# Patient Record
Sex: Female | Born: 1965 | Race: White | Hispanic: No | Marital: Married | Smoking: Never smoker
Health system: Southern US, Community
[De-identification: ages and names within clinical notes are randomized; demographics above are authoritative.]

## PROBLEM LIST (undated history)

## (undated) DIAGNOSIS — I10 Essential (primary) hypertension: Secondary | ICD-10-CM

## (undated) DIAGNOSIS — R42 Dizziness and giddiness: Secondary | ICD-10-CM

## (undated) DIAGNOSIS — T7840XA Allergy, unspecified, initial encounter: Secondary | ICD-10-CM

## (undated) HISTORY — DX: Dizziness and giddiness: R42

## (undated) HISTORY — DX: Allergy, unspecified, initial encounter: T78.40XA

## (undated) HISTORY — PX: MANDIBLE SURGERY: SHX707

---

## 2002-01-24 ENCOUNTER — Emergency Department (HOSPITAL_COMMUNITY): Admission: EM | Admit: 2002-01-24 | Discharge: 2002-01-24 | Payer: Self-pay | Admitting: Emergency Medicine

## 2004-04-18 HISTORY — PX: FRACTURE SURGERY: SHX138

## 2004-05-16 ENCOUNTER — Inpatient Hospital Stay (HOSPITAL_COMMUNITY): Admission: EM | Admit: 2004-05-16 | Discharge: 2004-05-19 | Payer: Self-pay | Admitting: Emergency Medicine

## 2004-07-07 ENCOUNTER — Encounter: Admission: RE | Admit: 2004-07-07 | Discharge: 2004-09-27 | Payer: Self-pay | Admitting: Orthopedic Surgery

## 2005-09-23 ENCOUNTER — Other Ambulatory Visit: Admission: RE | Admit: 2005-09-23 | Discharge: 2005-09-23 | Payer: Self-pay | Admitting: Internal Medicine

## 2005-10-26 ENCOUNTER — Encounter: Admission: RE | Admit: 2005-10-26 | Discharge: 2005-10-26 | Payer: Self-pay | Admitting: Internal Medicine

## 2005-12-21 ENCOUNTER — Ambulatory Visit: Payer: Self-pay | Admitting: Family Medicine

## 2006-01-18 ENCOUNTER — Ambulatory Visit: Payer: Self-pay | Admitting: Family Medicine

## 2006-04-19 ENCOUNTER — Emergency Department (HOSPITAL_COMMUNITY): Admission: EM | Admit: 2006-04-19 | Discharge: 2006-04-19 | Payer: Self-pay | Admitting: Emergency Medicine

## 2006-05-17 ENCOUNTER — Ambulatory Visit: Payer: Self-pay | Admitting: Family Medicine

## 2006-06-11 DIAGNOSIS — F3289 Other specified depressive episodes: Secondary | ICD-10-CM | POA: Insufficient documentation

## 2006-06-11 DIAGNOSIS — J309 Allergic rhinitis, unspecified: Secondary | ICD-10-CM | POA: Insufficient documentation

## 2006-06-11 DIAGNOSIS — G43009 Migraine without aura, not intractable, without status migrainosus: Secondary | ICD-10-CM

## 2006-06-11 DIAGNOSIS — F329 Major depressive disorder, single episode, unspecified: Secondary | ICD-10-CM

## 2006-06-13 ENCOUNTER — Ambulatory Visit: Payer: Self-pay | Admitting: Family Medicine

## 2006-06-14 ENCOUNTER — Encounter (INDEPENDENT_AMBULATORY_CARE_PROVIDER_SITE_OTHER): Payer: Self-pay | Admitting: Family Medicine

## 2006-10-06 ENCOUNTER — Ambulatory Visit: Payer: Self-pay | Admitting: Family Medicine

## 2006-10-06 ENCOUNTER — Encounter (INDEPENDENT_AMBULATORY_CARE_PROVIDER_SITE_OTHER): Payer: Self-pay | Admitting: Family Medicine

## 2006-10-06 ENCOUNTER — Other Ambulatory Visit: Admission: RE | Admit: 2006-10-06 | Discharge: 2006-10-06 | Payer: Self-pay | Admitting: Family Medicine

## 2006-10-08 LAB — CONVERTED CEMR LAB
Basophils Absolute: 0 10*3/uL (ref 0.0–0.1)
Basophils Relative: 0.1 % (ref 0.0–1.0)
Cholesterol: 158 mg/dL (ref 0–200)
Eosinophils Relative: 0.8 % (ref 0.0–5.0)
HCT: 41.1 % (ref 36.0–46.0)
Hemoglobin: 13.7 g/dL (ref 12.0–15.0)
LDL Cholesterol: 100 mg/dL — ABNORMAL HIGH (ref 0–99)
MCHC: 33.3 g/dL (ref 30.0–36.0)
Monocytes Absolute: 0.4 10*3/uL (ref 0.2–0.7)
Neutrophils Relative %: 71.1 % (ref 43.0–77.0)
RBC: 4.8 M/uL (ref 3.87–5.11)
RDW: 13.2 % (ref 11.5–14.6)
Total CHOL/HDL Ratio: 3.8
VLDL: 16 mg/dL (ref 0–40)
WBC: 6.4 10*3/uL (ref 4.5–10.5)

## 2006-10-09 ENCOUNTER — Telehealth (INDEPENDENT_AMBULATORY_CARE_PROVIDER_SITE_OTHER): Payer: Self-pay | Admitting: *Deleted

## 2006-10-18 ENCOUNTER — Telehealth (INDEPENDENT_AMBULATORY_CARE_PROVIDER_SITE_OTHER): Payer: Self-pay | Admitting: *Deleted

## 2006-12-27 ENCOUNTER — Encounter: Payer: Self-pay | Admitting: Internal Medicine

## 2007-01-03 ENCOUNTER — Ambulatory Visit: Payer: Self-pay | Admitting: Family Medicine

## 2007-01-03 DIAGNOSIS — R079 Chest pain, unspecified: Secondary | ICD-10-CM

## 2007-01-17 ENCOUNTER — Encounter (INDEPENDENT_AMBULATORY_CARE_PROVIDER_SITE_OTHER): Payer: Self-pay | Admitting: Family Medicine

## 2007-01-17 ENCOUNTER — Other Ambulatory Visit: Admission: RE | Admit: 2007-01-17 | Discharge: 2007-01-17 | Payer: Self-pay | Admitting: Family Medicine

## 2007-01-17 ENCOUNTER — Ambulatory Visit: Payer: Self-pay | Admitting: Family Medicine

## 2007-01-29 ENCOUNTER — Telehealth (INDEPENDENT_AMBULATORY_CARE_PROVIDER_SITE_OTHER): Payer: Self-pay | Admitting: *Deleted

## 2007-05-08 ENCOUNTER — Ambulatory Visit: Payer: Self-pay | Admitting: Family Medicine

## 2007-05-08 DIAGNOSIS — R51 Headache: Secondary | ICD-10-CM

## 2007-05-08 DIAGNOSIS — R42 Dizziness and giddiness: Secondary | ICD-10-CM

## 2007-05-08 DIAGNOSIS — R519 Headache, unspecified: Secondary | ICD-10-CM | POA: Insufficient documentation

## 2007-05-14 ENCOUNTER — Encounter: Admission: RE | Admit: 2007-05-14 | Discharge: 2007-05-14 | Payer: Self-pay | Admitting: Family Medicine

## 2007-05-23 ENCOUNTER — Telehealth (INDEPENDENT_AMBULATORY_CARE_PROVIDER_SITE_OTHER): Payer: Self-pay | Admitting: *Deleted

## 2007-05-24 ENCOUNTER — Telehealth (INDEPENDENT_AMBULATORY_CARE_PROVIDER_SITE_OTHER): Payer: Self-pay | Admitting: *Deleted

## 2007-05-25 ENCOUNTER — Telehealth (INDEPENDENT_AMBULATORY_CARE_PROVIDER_SITE_OTHER): Payer: Self-pay | Admitting: *Deleted

## 2007-06-01 ENCOUNTER — Ambulatory Visit (HOSPITAL_COMMUNITY): Admission: RE | Admit: 2007-06-01 | Discharge: 2007-06-01 | Payer: Self-pay | Admitting: Family Medicine

## 2007-06-08 ENCOUNTER — Ambulatory Visit: Payer: Self-pay | Admitting: Family Medicine

## 2007-06-14 ENCOUNTER — Encounter (INDEPENDENT_AMBULATORY_CARE_PROVIDER_SITE_OTHER): Payer: Self-pay | Admitting: *Deleted

## 2007-09-20 ENCOUNTER — Encounter: Payer: Self-pay | Admitting: Internal Medicine

## 2007-09-25 ENCOUNTER — Telehealth (INDEPENDENT_AMBULATORY_CARE_PROVIDER_SITE_OTHER): Payer: Self-pay | Admitting: *Deleted

## 2008-01-12 ENCOUNTER — Encounter: Payer: Self-pay | Admitting: Internal Medicine

## 2010-09-03 NOTE — Discharge Summary (Signed)
NAMEKOLBIE, CLARKSTON NO.:  [id number]   MEDICAL RECORD NO.:  [id number]          PATIENT TYPE:  INP   LOCATION:  5039                         FACILITY:  [facility]   PHYSICIAN:  Vania Rea. Supple, M.D.  DATE OF BIRTH:  Sep 16, 1965   DATE OF ADMISSION:  05/16/2004  DATE OF DISCHARGE:  05/19/2004                                 DISCHARGE SUMMARY   ADMISSION DIAGNOSES:  1.  Left fifth metatarsal fracture and calcaneus avulsion fracture.  2.  Displaced right distal fibular fracture.   DISCHARGE DIAGNOSES:  1.  Left fifth metatarsal fracture and calcaneus avulsion fracture.  2.  Displaced right distal fibular fracture.  3.  Status post open reduction and internal fixation of right distal fibular      fracture.   OPERATION:  Open reduction and internal fixation of right distal fibular  fracture.   SURGEON:  Vania Rea. Supple, M.D.   Threasa HeadsFrench Ana A. Shuford, P.A.-C.   ANESTHESIA:  General anesthetic.   BRIEF HISTORY:  Ms. Amsden is a 45 year old female who slipped and fell in  the home and had bilateral ankle pain and inability to bear weight.  X-rays  in the emergency department showed a displaced right distal fibular fracture  as well as a fifth metatarsal fracture and a calcaneal avulsion fracture.  She was treated with closed casting on the left, but operative intervention,  as mentioned, was indicated.  The risks and benefits were discussed and she  wished to proceed.   HOSPITAL COURSE:  The patient was admitted on __________ all appropriate IV  antibiotics and analgesics were utilized.  Postoperatively she was allowed  weightbearing in her cam walker on the left for transfers and  nonweightbearing on the right, touchdown weightbearing at max.  Postoperatively she began working with therapy.  Home needs were arranged.  By date May 19, 2004 she was doing well.  She had been afebrile.  Neurovascularly she was intact.  Her splints were fitting well without  difficulty.  At this time she was stable for discharge to home in the care  of her family and follow up on an outpatient basis.   LABORATORY DATA:  Preoperative labs within normal limits except for white  count of 13.3, sodium 134.  Urinalysis negative.  Chest x-ray was negative  on date May 16, 2004.  Fractures of the ankle and foot as noted above.  EKG showed normal sinus rhythm.   CONDITION ON DISCHARGE:  Stable and improved.   DISCHARGE MEDICATIONS AND PLANS:  The patient is being discharged to home.  She is to stand weightbearing on the right, weightbearing on the left, use  cam walker for transfers.  Keep splints clean and dry.  Follow up in 2  weeks, call for time.  Prescription for Robaxin, Percocet, and Restoril  provided.  Take an aspirin at home.  Resume home meds on diet.      TAS/MEDQ  D:  07/13/2004  T:  07/13/2004  Job:  [id number]

## 2010-09-03 NOTE — Op Note (Signed)
Mercedes Robertson, Mercedes Robertson NO.:  [id number]   MEDICAL RECORD NO.:  [id number]          PATIENT TYPE:  INP   LOCATION:  5039                         FACILITY:  [facility]   PHYSICIAN:  Vania Rea. Supple, M.D.  DATE OF BIRTH:  1965-11-14   DATE OF PROCEDURE:  05/17/2004  DATE OF DISCHARGE:                                 OPERATIVE REPORT   PREOPERATIVE DIAGNOSIS:  Displaced right distal fibula fracture.   POSTOPERATIVE DIAGNOSIS:  Displaced right distal fibula fracture.   OPERATION PERFORMED:  Open reduction internal fixation of right distal  fibular fracture.   SURGEON:  Vania Rea. Supple, M.D.   Threasa HeadsFrench Ana A. Shuford, P.A.-C.   ANESTHESIA:  General endotracheal.   TOURNIQUET TIME:  Less than one hour.   ESTIMATED BLOOD LOSS:  Minimal.   DRAINS:  None.   INDICATIONS FOR PROCEDURE:  [name] is a 45 year old female who slipped  at home yesterday evening while descending some stairs and injured both of  her feet and ankles.  On evaluation in the emergency room last night, she  was found to have local swelling and tenderness over the distal fibula on  the right.  The left foot showed diffuse lateral tenderness about the  forefoot, midfoot and hindfoot.  Radiographs confirmed a mildly comminuted  and displaced fracture of the right distal fibula as well as a left fifth  metatarsal shaft fracture and small avulsion fractures from the dorsum of  the navicular and the lateral calcaneus on the left.  The left foot and  ankle injuries were felt to be amenable to nonoperative management.  The  right ankle injury, however, would benefit from open reduction internal  fixation to restore anatomic alignment.   I discussed various treatment options with Mr. Tabak and her husband  preoperatively.  Possible surgical complications reviewed including the  potential for bleeding, infection, neurovascular injury, deep venous  thrombosis, pulmonary embolus, malunion, nonunion,  loss of fixation, post  traumatic arthritis and possible need for additional surgery.  They  understand and accept and agree with our planned procedure.   DESCRIPTION OF PROCEDURE:  After undergoing routine preop evaluation, the  patient received prophylactic antibiotics.  Placed supine on the operating  table and underwent smooth induction of general endotracheal anesthesia.  Tourniquet applied to the right thigh and the right leg was sterilely  prepped and draped in the standard fashion.  Leg was exsanguinated with the  tourniquet inflated at 350 mmHg.  A lateral longitudinal incision was made  over the distal shaft of the fibula, total length approximately 8 cm.  Skin  flaps were elevated circumferentially.  The distal fibular shaft was then  identified and exposed in subperiosteal fashion with the peroneal muscle and  tendon reflected posteriorly.  The fracture site had a degree of comminution  and this was carefully reduced under direct visualization with interposed  soft tissue, both fragments removed from the fracture site.  Reduction under  direct visualization was achieved.  Fluoroscopic images were then obtained  which confirmed good alignment of the fracture site.  A 6-hole one-third  tubular plate  was then contoured to fit over the lateral aspect of the  distal fibula.  This was then fastened with standard AO technique with 3.5  cortical screws proximally times four and two 4.0 cancellous screws  distally.  Good bony purchase was achieved from the screws.  Final  fluoroscopic images confirmed good alignment of the fracture site and good  position of the hardware.  The wound was then copiously irrigated.  The  wound was closed with 0 Vicryl to the deep fascia, 2-0 Vicryl to the subcu  and intracuticular 3-0 Monocryl for the skin followed by Steri-Strips.  0.5%  Marcaine with epinephrine was instilled along the skin edges.  A bulky dry  dressing was then applied followed by a  well padded short leg plaster splint  with the ankle in neutral position.  The tourniquet was then let down.  The  patient was extubated and taken to recovery room in stable condition.  I  should also note that we did reposition and appropriately pad the left foot  and ankle and the Cam walker splint prior to the beginning of the case to  make the left foot was properly positioned and padded within her Cam walker  boot.      KMS/MEDQ  D:  05/17/2004  T:  05/17/2004  Job:  [id number]

## 2010-09-03 NOTE — Assessment & Plan Note (Signed)
[facility]                          [place] OFFICE NOTE   NAME:Robertson, Mercedes ISABELL                    MRN:          [id number]  DATE:12/21/2005                            DOB:          06/10/1965    REASON FOR VISIT:  Left wrist pain.   HISTORY:  Mercedes Robertson is a 45 year old female presenting here to establish  care and complains that over the last week to week and a half she has been  having significant pain of the left wrist, specifically the ulnar side.  The  pain is precipitated with gripping, holding objects, and lifting her  children.  She denied any specific injuries but states that she is very  active with her upper extremities.  She is not taking anything for the pain.  She denies any weakness but there is mild tingling in the fourth and fifth  digit when the discomfort is significant.  The pain is usually worse in the  evening.   Additionally, Mercedes Robertson started to reveal a several month history of  depressed mood, lack of energy, and drive, increased irritability with her  children, and difficulties coping with the separation from her husband.  She  states that initially when she was separated from her husband, she was  started on Lexapro which was not at all helpful.  She did see a therapist  but again did not find that helpful at that point.  She denies any homicidal  or suicidal ideations.   PAST MEDICAL HISTORY:  1. Seasonal allergic rhinitis.  2. TMJ, status post jaw surgery.   PAST SURGICAL HISTORY:  1. Fracture of the right ankle.  2. Fracture of the left foot.   MEDICATIONS:  Zyrtec 10 mg q. day.   FAMILY HISTORY:  Mother with a history of hypertension and father with  hypertension and carotid artery disease.  Brother and sister are alive and  well.   SOCIAL HISTORY:  She is separated with two children, she is unemployed, and  stays at home with her children.  She denies any tobacco or alcohol use.   REVIEW  OF SYSTEMS:  As per history of present illness, otherwise  unremarkable.   PHYSICAL EXAMINATION:  VITALS:  Weight 217, pulse of 86, and blood pressure  120/88.  GENERAL:  We have a pleasant female who became tearful as she spoke about  her mood.  Speech was regular rate and rhythm.  Affect is depressed.  EXTREMITIES:  Examination of the left upper extremity is significant for no  obvious deformity.  There is mild tenderness just distal to the ulnar  styloid.  Pain is precipitated with grip as well as resisted pronation and  supination of the wrist.   IMPRESSION:  1. Left wrist strain.  2. Major depressive episode.   PLAN:  1. In regards to her wrist, the patient will be started on Naprosyn 500 mg      b.i.d. for 10 to 15 days.  She will also be started on over-the-counter      products like OTC.  The side effects of the medicines will be reviewed.  2. In regards to her depression, I did advise the patient to seek a      therapist.  According to Mercedes Robertson, her son sees a therapist and she      has been invited to see her after the patient.  The patient states that      she will call to set that up.  3. After a long discussion, she would prefer to start on antidepressant      and so I will start on Cymbalta 30 mg q. day for one week and increase      to 60 mg q. day.  Side effects were reviewed including the black box      warning of all antidepressants.  4. The patient is to follow up with me in four weeks or sooner if needed.                                   Leanne Chang, M.D.   LA/MedQ  DD:  12/21/2005  DT:  12/22/2005  Job #:  [id number]

## 2011-01-07 LAB — BASIC METABOLIC PANEL
CO2: 29
Calcium: 8.7
Chloride: 105
GFR calc Af Amer: >60
Glucose, Bld: 95
Potassium: 3.8
Sodium: 139

## 2011-01-07 LAB — CBC
HCT: 37.3
Hemoglobin: 12.7
MCHC: 34
RBC: 4.34
RDW: 13.1

## 2011-01-07 LAB — PROTIME-INR: INR: 0.9

## 2011-10-22 ENCOUNTER — Emergency Department (HOSPITAL_BASED_OUTPATIENT_CLINIC_OR_DEPARTMENT_OTHER)
Admission: EM | Admit: 2011-10-22 | Discharge: 2011-10-22 | Disposition: A | Discharge status: Home / Self Care | Payer: BC Managed Care – PPO | Attending: Emergency Medicine | Admitting: Emergency Medicine

## 2011-10-22 ENCOUNTER — Emergency Department (HOSPITAL_BASED_OUTPATIENT_CLINIC_OR_DEPARTMENT_OTHER): Payer: BC Managed Care – PPO

## 2011-10-22 ENCOUNTER — Encounter (HOSPITAL_BASED_OUTPATIENT_CLINIC_OR_DEPARTMENT_OTHER): Payer: Self-pay | Admitting: *Deleted

## 2011-10-22 DIAGNOSIS — S63259A Unspecified dislocation of unspecified finger, initial encounter: Secondary | ICD-10-CM

## 2011-10-22 DIAGNOSIS — Y936A Activity, physical games generally associated with school recess, summer camp and children: Secondary | ICD-10-CM | POA: Insufficient documentation

## 2011-10-22 DIAGNOSIS — X58XXXA Exposure to other specified factors, initial encounter: Secondary | ICD-10-CM | POA: Insufficient documentation

## 2011-10-22 DIAGNOSIS — IMO0002 Reserved for concepts with insufficient information to code with codable children: Secondary | ICD-10-CM | POA: Insufficient documentation

## 2011-10-22 DIAGNOSIS — Y998 Other external cause status: Secondary | ICD-10-CM | POA: Insufficient documentation

## 2011-10-22 DIAGNOSIS — S62619A Displaced fracture of proximal phalanx of unspecified finger, initial encounter for closed fracture: Secondary | ICD-10-CM

## 2011-10-22 MED ORDER — HYDROCODONE-ACETAMINOPHEN 5-500 MG PO TABS: 1.0000 | ORAL_TABLET | Freq: Four times a day (QID) | ORAL | Status: AC | PRN

## 2011-10-22 MED ORDER — OXYCODONE-ACETAMINOPHEN 5-325 MG PO TABS
1.0000 | ORAL_TABLET | Freq: Once | ORAL | Status: AC
  Administered 2011-10-22: 1 via ORAL
  Filled 2011-10-22: qty 1

## 2011-10-22 NOTE — ED Provider Notes (Addendum)
History   This chart was scribed for Gwyneth Sprout, MD by Sofie Rower. The patient was seen in room MH12/MH12 and the patient's care was started at 3:37PM    CSN: [id number]  Arrival date & time 10/22/11  1413   First MD Initiated Contact with Patient 10/22/11 1536      Chief Complaint  Patient presents with  . Finger Injury    (Consider location/radiation/quality/duration/timing/severity/associated sxs/prior treatment) Patient is a 46 y.o. female presenting with hand pain. The history is provided by the patient.  Hand Pain This is a new problem. The current episode started 1 to 2 hours ago. The problem occurs constantly. The problem has not changed since onset.The symptoms are aggravated by bending. Nothing relieves the symptoms. She has tried nothing for the symptoms. The treatment provided no relief.    Mercedes Robertson is a 46 y.o. female who presents to the Emergency Department complaining of moderate, episodic finger injury onset today (2 hours ago). The pt informs the EDP that she was playing kickball where she injured her hand. Pt denies any allergies to medications.   PCP is Dr. Drue Novel.    History reviewed. No pertinent past medical history.  Past Surgical History  Procedure Date  . Mandible surgery   . Fracture surgery     History reviewed. No pertinent family history.  History  Substance Use Topics  . Smoking status: Never Smoker   . Smokeless tobacco: Not on file  . Alcohol Use: Yes    OB History    Grav Para Term Preterm Abortions TAB SAB Ect Mult Living                  Review of Systems  All other systems reviewed and are negative.    10 Systems reviewed and all are negative for acute change except as noted in the HPI.    Allergies  Review of patient's allergies indicates no known allergies.  Home Medications   Current Outpatient Rx  Name Route Sig Dispense Refill  . CETIRIZINE HCL 10 MG PO TABS Oral Take 10 mg by mouth daily.    Marland Kitchen  CLONAZEPAM 1 MG PO TABS Oral Take 1 mg by mouth daily.    Marland Kitchen PHENTERMINE HCL 37.5 MG PO CAPS Oral Take 37.5 mg by mouth every morning.    Marland Kitchen RISPERIDONE 1 MG PO TABS Oral Take 1 mg by mouth daily.      BP 140/98  Pulse 82  Temp 98.3 F (36.8 C) (Oral)  Resp 20  Ht 5\' 5"  (1.651 m)  Wt 175 lb (79.379 kg)  BMI 29.12 kg/m2  SpO2 100%  LMP 10/22/2011  Physical Exam  Nursing note and vitals reviewed. Constitutional: She appears well-developed and well-nourished. She appears distressed.  HENT:  Head: Normocephalic and atraumatic.  Nose: Nose normal.  Eyes: EOM are normal. Pupils are equal, round, and reactive to light.  Musculoskeletal: Normal range of motion.       Hands: Neurological: She is alert.  Skin: Skin is warm and dry.  Psychiatric: She has a normal mood and affect. Her behavior is normal.    ED Course  Reduction of dislocation Date/Time: 10/22/2011 4:14 PM Performed by: Gwyneth Sprout Authorized by: Gwyneth Sprout Consent: Verbal consent obtained. Consent given by: patient Imaging studies: imaging studies available Local anesthesia used: no Patient sedated: no Patient tolerance: Patient tolerated the procedure well with no immediate complications. Comments: Left fifth PIP joint dislocation was reduced in the ER with manual  finger extenstion then flexion and traction.     (including critical care time)  DIAGNOSTIC STUDIES: Oxygen Saturation is 100% on room air, normal by my interpretation.    COORDINATION OF CARE:   3:39PM- EDP at bedside discusses treatment plan concerning results of x-ray and straightening the finger.  3:39PM- EDP at bedside straightens finger.  3:40PM- EDP at bedside discusses treatment plan concerning x-ray.   Labs Reviewed - No data to display Dg Finger Little Left  10/22/2011  *RADIOLOGY REPORT*  Clinical Data: Status post reduction of a left fifth PIP joint dislocation.  LEFT LITTLE FINGER 2+V  Comparison: Earlier today.   Findings: The previously seen dislocation at the fifth PIP joint has been reduced.  Currently, the bones are in normal position and alignment.  There is proximal soft tissue swelling.  There is also a small avulsion fracture fragment ventral to the base of the fifth middle phalanx at the PIP joint.  IMPRESSION:  1.  Status post reduction of the previously seen dislocation at the fifth PIP joint. 2.  Small volar plate avulsion fracture off the base of the fifth middle phalanx.  Original Report Authenticated By: Darrol Angel, M.D.   Dg Finger Little Left  10/22/2011  *RADIOLOGY REPORT*  Clinical Data: Left little finger pain and deformity following an injury.  LEFT LITTLE FINGER 2+V  Comparison: None.  Findings: Ulnar and ventral dislocation of the fifth middle phalanx relative to the proximal phalanx.  Ulnar and ventral angulation of the middle phalanx relative to the proximal phalanx.  No fracture seen.  IMPRESSION: Dislocation at the fifth PIP joint, as described above.  Original Report Authenticated By: Darrol Angel, M.D.     1. Finger dislocation   2. Avulsion fracture of proximal phalanx of finger       MDM   Patient with a finger dislocation today that was reduced at bedside. Finger splint was applied and repeat films showed no sign of fracture.  Repeat film shows small avulsion fracture.      I personally performed the services described in this documentation, which was scribed in my presence.  The recorded information has been reviewed and considered.    Gwyneth Sprout, MD 10/22/11 1615  Gwyneth Sprout, MD 10/22/11 9562  Gwyneth Sprout, MD 10/22/11 1635

## 2011-10-22 NOTE — ED Notes (Signed)
Pt injured her left little finger playing kickball. Obvious deformity. Feels touch. Cap refill < 3 sec

## 2011-12-14 ENCOUNTER — Ambulatory Visit: Payer: BC Managed Care – PPO | Attending: Orthopedic Surgery | Admitting: Physical Therapy

## 2011-12-14 DIAGNOSIS — IMO0001 Reserved for inherently not codable concepts without codable children: Secondary | ICD-10-CM | POA: Insufficient documentation

## 2011-12-14 DIAGNOSIS — M25649 Stiffness of unspecified hand, not elsewhere classified: Secondary | ICD-10-CM | POA: Insufficient documentation

## 2015-12-23 ENCOUNTER — Emergency Department (HOSPITAL_BASED_OUTPATIENT_CLINIC_OR_DEPARTMENT_OTHER): Payer: Worker's Compensation

## 2015-12-23 ENCOUNTER — Emergency Department (HOSPITAL_BASED_OUTPATIENT_CLINIC_OR_DEPARTMENT_OTHER)
Admission: EM | Admit: 2015-12-23 | Discharge: 2015-12-23 | Disposition: A | Discharge status: Home / Self Care | Payer: Worker's Compensation | Attending: Emergency Medicine | Admitting: Emergency Medicine

## 2015-12-23 ENCOUNTER — Encounter (HOSPITAL_BASED_OUTPATIENT_CLINIC_OR_DEPARTMENT_OTHER): Payer: Self-pay | Admitting: *Deleted

## 2015-12-23 DIAGNOSIS — W0110XA Fall on same level from slipping, tripping and stumbling with subsequent striking against unspecified object, initial encounter: Secondary | ICD-10-CM | POA: Diagnosis not present

## 2015-12-23 DIAGNOSIS — Y9301 Activity, walking, marching and hiking: Secondary | ICD-10-CM | POA: Insufficient documentation

## 2015-12-23 DIAGNOSIS — I10 Essential (primary) hypertension: Secondary | ICD-10-CM | POA: Insufficient documentation

## 2015-12-23 DIAGNOSIS — Y929 Unspecified place or not applicable: Secondary | ICD-10-CM | POA: Diagnosis not present

## 2015-12-23 DIAGNOSIS — Y999 Unspecified external cause status: Secondary | ICD-10-CM | POA: Insufficient documentation

## 2015-12-23 DIAGNOSIS — S8992XA Unspecified injury of left lower leg, initial encounter: Secondary | ICD-10-CM | POA: Diagnosis present

## 2015-12-23 DIAGNOSIS — S82002A Unspecified fracture of left patella, initial encounter for closed fracture: Secondary | ICD-10-CM | POA: Diagnosis not present

## 2015-12-23 DIAGNOSIS — Z79899 Other long term (current) drug therapy: Secondary | ICD-10-CM | POA: Diagnosis not present

## 2015-12-23 HISTORY — DX: Essential (primary) hypertension: I10

## 2015-12-23 MED ORDER — OXYCODONE-ACETAMINOPHEN 5-325 MG PO TABS: 1.0000 | ORAL_TABLET | Freq: Four times a day (QID) | ORAL | 0 refills | Status: DC | PRN

## 2015-12-23 NOTE — ED Provider Notes (Signed)
[facility]   CSN: [id number] Arrival date & time: 12/23/15  1025     History   Chief Complaint Chief Complaint  Patient presents with  . Fall    HPI Mercedes Robertson is a 50 y.o. female.  Patient is a 50 year old female with history of hypertension. She presents for evaluation of left knee pain. She reports she was walking at work when she slipped and fell, striking her left knee. She is having pain with range of motion and difficulty ambulating. She denies any prior knee injury or problems.   The history is provided by the patient.  Fall  This is a new problem. The current episode started 1 to 2 hours ago. The problem occurs constantly. The problem has not changed since onset.The symptoms are aggravated by walking and standing. Nothing relieves the symptoms. She has tried nothing for the symptoms.    Past Medical History:  Diagnosis Date  . Hypertension     Patient Active Problem List   Diagnosis Date Noted  . VERTIGO 05/08/2007  . HEADACHE 05/08/2007  . CHEST PAIN 01/03/2007  . DEPRESSION 06/11/2006  . MIGRAINE, COMMON 06/11/2006  . ALLERGIC RHINITIS 06/11/2006    Past Surgical History:  Procedure Laterality Date  . FRACTURE SURGERY    . MANDIBLE SURGERY      OB History    No data available       Home Medications    Prior to Admission medications   Medication Sig Start Date End Date Taking? Authorizing Provider  lisinopril-hydrochlorothiazide (PRINZIDE,ZESTORETIC) 20-25 MG tablet Take 1 tablet by mouth daily.   Yes Historical Provider, MD  cetirizine (ZYRTEC) 10 MG tablet Take 10 mg by mouth daily. Patient uses this medication for allergy symptoms.    Historical Provider, MD    Family History No family history on file.  Social History Social History  Substance Use Topics  . Smoking status: Never Smoker  . Smokeless tobacco: Never Used  . Alcohol use Yes     Allergies   Review of patient's allergies indicates no  known allergies.   Review of Systems Review of Systems  All other systems reviewed and are negative.    Physical Exam Updated Vital Signs BP 140/96 (BP Location: Left Arm)   Pulse 70   Temp 98.5 F (36.9 C) (Oral)   Resp 18   Ht 5\' 1"  (1.549 m)   Wt 220 lb (99.8 kg)   SpO2 97%   BMI 41.57 kg/m   Physical Exam  Constitutional: She is oriented to person, place, and time. She appears well-developed and well-nourished. No distress.  HENT:  Head: Normocephalic and atraumatic.  Neck: Normal range of motion. Neck supple.  Musculoskeletal:  The left knee reveals a small effusion. She has pain with range of motion. Exam is limited secondary to pain.  Neurological: She is alert and oriented to person, place, and time.  Skin: Skin is warm and dry. She is not diaphoretic.  Nursing note and vitals reviewed.    ED Treatments / Results  Labs (all labs ordered are listed, but only abnormal results are displayed) Labs Reviewed - No data to display  EKG  EKG Interpretation None       Radiology No results found.  Procedures Procedures (including critical care time)  Medications Ordered in ED Medications - No data to display   Initial Impression / Assessment and Plan / ED Course  I have reviewed the triage vital signs and the nursing  notes.  Pertinent labs & imaging results that were available during my care of the patient were reviewed by me and considered in my medical decision making (see chart for details).  Clinical Course    X-rays reveal a nondisplaced, horizontally oriented fracture through the patella. I have consult with Dr. Merlyn Albert who is recommending a knee immobilizer and follow-up in the office tomorrow. She will be given pain medication and advised to be nonweightbearing until that time.  Final Clinical Impressions(s) / ED Diagnoses   Final diagnoses:  None    New Prescriptions New Prescriptions   No medications on file     Geoffery Lyons,  MD 12/23/15 [phone]

## 2015-12-23 NOTE — ED Notes (Signed)
Patient transported to X-ray 

## 2015-12-23 NOTE — ED Triage Notes (Signed)
Pt reports trip and fall just pta onto her left knee. Ice placed at triage, pt denies any other c/o or injuries.

## 2015-12-23 NOTE — Discharge Instructions (Signed)
Percocet as prescribed as needed for pain.  No weightbearing until followed up by orthopedics. [facility] follow-[facility].  Continue to wear your knee immobilizer.

## 2016-02-12 ENCOUNTER — Other Ambulatory Visit: Payer: Self-pay | Admitting: Otolaryngology

## 2016-02-12 DIAGNOSIS — R42 Dizziness and giddiness: Secondary | ICD-10-CM

## 2016-02-23 ENCOUNTER — Encounter: Payer: Self-pay | Admitting: Gastroenterology

## 2016-03-02 ENCOUNTER — Other Ambulatory Visit: Payer: Self-pay

## 2016-03-31 ENCOUNTER — Ambulatory Visit (AMBULATORY_SURGERY_CENTER): Payer: Self-pay | Admitting: *Deleted

## 2016-03-31 VITALS — Ht 64.0 in | Wt 231.8 lb

## 2016-03-31 DIAGNOSIS — Z1211 Encounter for screening for malignant neoplasm of colon: Secondary | ICD-10-CM

## 2016-03-31 NOTE — Progress Notes (Signed)
No egg or soy allergy  No home oxygen used or sleep apnea hx  No diet medications  No anesthesia or intubation problems per pt

## 2016-04-19 ENCOUNTER — Ambulatory Visit
Admission: RE | Admit: 2016-04-19 | Discharge: 2016-04-19 | Disposition: A | Discharge status: Home / Self Care | Payer: 59 | Source: Ambulatory Visit | Attending: Otolaryngology | Admitting: Otolaryngology

## 2016-04-19 DIAGNOSIS — R42 Dizziness and giddiness: Secondary | ICD-10-CM

## 2016-04-19 MED ORDER — GADOBENATE DIMEGLUMINE 529 MG/ML IV SOLN
20.0000 mL | Freq: Once | INTRAVENOUS | Status: AC | PRN
  Administered 2016-04-19: 20 mL via INTRAVENOUS

## 2016-04-21 ENCOUNTER — Telehealth: Payer: Self-pay | Admitting: Gastroenterology

## 2016-04-21 DIAGNOSIS — Z1211 Encounter for screening for malignant neoplasm of colon: Secondary | ICD-10-CM

## 2016-04-21 MED ORDER — NA SULFATE-K SULFATE-MG SULF 17.5-3.13-1.6 GM/177ML PO SOLN: ORAL | 0 refills | Status: DC

## 2016-04-21 NOTE — Telephone Encounter (Signed)
Sent RX to recovery and notified pt of this

## 2016-04-29 ENCOUNTER — Encounter: Payer: Self-pay | Admitting: Gastroenterology

## 2016-04-29 ENCOUNTER — Ambulatory Visit (AMBULATORY_SURGERY_CENTER): Payer: 59 | Admitting: Gastroenterology

## 2016-04-29 VITALS — BP 129/90 | HR 61 | Temp 97.8°F | Resp 19 | Ht 64.0 in | Wt 231.0 lb

## 2016-04-29 DIAGNOSIS — Z1212 Encounter for screening for malignant neoplasm of rectum: Secondary | ICD-10-CM

## 2016-04-29 DIAGNOSIS — Z1211 Encounter for screening for malignant neoplasm of colon: Secondary | ICD-10-CM

## 2016-04-29 MED ORDER — SODIUM CHLORIDE 0.9 % IV SOLN: 500.0000 mL | INTRAVENOUS | Status: AC

## 2016-04-29 NOTE — Op Note (Signed)
[facility] Patient Name: Mercedes Robertson Procedure Date: 04/29/2016 10:39 AM MRN: [id number] Endoscopist: Meryl Dare , MD Age: 51 Referring MD:  Date of Birth: [birth year] Gender: Female Account #: [id number] Procedure:                Colonoscopy Indications:              Screening for colorectal malignant neoplasm Medicines:                Monitored Anesthesia Care Procedure:                Pre-Anesthesia Assessment:                           - Prior to the procedure, a History and Physical                            was performed, and patient medications and                            allergies were reviewed. The patient's tolerance of                            previous anesthesia was also reviewed. The risks                            and benefits of the procedure and the sedation                            options and risks were discussed with the patient.                            All questions were answered, and informed consent                            was obtained. Prior Anticoagulants: The patient has                            taken no previous anticoagulant or antiplatelet                            agents. ASA Grade Assessment: II - A patient with                            mild systemic disease. After reviewing the risks                            and benefits, the patient was deemed in                            satisfactory condition to undergo the procedure.                           After obtaining informed consent, the colonoscope  was passed under direct vision. Throughout the                            procedure, the patient's blood pressure, pulse, and                            oxygen saturations were monitored continuously. The                            Model PCF-[id number]) scope was introduced                            through the anus and advanced to the the cecum,                            identified  by appendiceal orifice and ileocecal                            valve. The ileocecal valve, appendiceal orifice,                            and rectum were photographed. The quality of the                            bowel preparation was excellent. The colonoscopy                            was performed without difficulty. The patient                            tolerated the procedure well. Scope In: 10:47:31 AM Scope Out: 10:59:35 AM Scope Withdrawal Time: 0 hours 9 minutes 25 seconds  Total Procedure Duration: 0 hours 12 minutes 4 seconds  Findings:                 The perianal and digital rectal examinations were                            normal.                           A few small-mouthed diverticula were found in the                            sigmoid colon.                           The exam was otherwise without abnormality on                            direct and retroflexion views. Complications:            No immediate complications. Estimated blood loss:                            None. Estimated Blood Loss:  Estimated blood loss: none. Impression:               - Diverticulosis in the sigmoid colon.                           - The examination was otherwise normal on direct                            and retroflexion views.                           - No specimens collected. Recommendation:           - Repeat colonoscopy in 10 years for screening                            purposes.                           - Patient has a contact number available for                            emergencies. The signs and symptoms of potential                            delayed complications were discussed with the                            patient. Return to normal activities tomorrow.                            Written discharge instructions were provided to the                            patient.                           - High fiber diet.                           - Continue  present medications. Meryl DareMalcolm T Powell Halbert, MD 04/29/2016 11:01:35 AM This report has been signed electronically.

## 2016-04-29 NOTE — Progress Notes (Signed)
Report to PACU, RN, vss, BBS= Clear.  

## 2016-04-29 NOTE — Patient Instructions (Signed)
Impression/Recommendations:  Diverticulosis handout given to patient.  Repeat colonoscopy in 10 years for screening purposes.  High Fiber diet.  YOU HAD AN ENDOSCOPIC PROCEDURE TODAY AT THE [facility]:   Refer to the procedure report that was given to you for any specific questions about what was found during the examination.  If the procedure report does not answer your questions, please call your gastroenterologist to clarify.  If you requested that your care partner not be given the details of your procedure findings, then the procedure report has been included in a sealed envelope for you to review at your convenience later.  YOU SHOULD EXPECT: Some feelings of bloating in the abdomen. Passage of more gas than usual.  Walking can help get rid of the air that was put into your GI tract during the procedure and reduce the bloating. If you had a lower endoscopy (such as a colonoscopy or flexible sigmoidoscopy) you may notice spotting of blood in your stool or on the toilet paper. If you underwent a bowel prep for your procedure, you may not have a normal bowel movement for a few days.  Please Note:  You might notice some irritation and congestion in your nose or some drainage.  This is from the oxygen used during your procedure.  There is no need for concern and it should clear up in a day or so.  SYMPTOMS TO REPORT IMMEDIATELY:   Following lower endoscopy (colonoscopy or flexible sigmoidoscopy):  Excessive amounts of blood in the stool  Significant tenderness or worsening of abdominal pains  Swelling of the abdomen that is new, acute  Fever of 100F or higher For urgent or emergent issues, a gastroenterologist can be reached at any hour by calling [phone].   DIET:  We do recommend a small meal at first, but then you may proceed to your regular diet.  Drink plenty of fluids but you should avoid alcoholic beverages for 24 hours.  ACTIVITY:  You should plan to take it  easy for the rest of today and you should NOT DRIVE or use heavy machinery until tomorrow (because of the sedation medicines used during the test).    FOLLOW UP: Our staff will call the number listed on your records the next business day following your procedure to check on you and address any questions or concerns that you may have regarding the information given to you following your procedure. If we do not reach you, we will leave a message.  However, if you are feeling well and you are not experiencing any problems, there is no need to return our call.  We will assume that you have returned to your regular daily activities without incident.  If any biopsies were taken you will be contacted by phone or by letter within the next 1-3 weeks.  Please call us at [phone] if you have not heard about the biopsies in 3 weeks.    SIGNATURES/CONFIDENTIALITY: You and/or your care partner have signed paperwork which will be entered into your electronic medical record.  These signatures attest to the fact that that the information above on your After Visit Summary has been reviewed and is understood.  Full responsibility of the confidentiality of this discharge information lies with you and/or your care-partner.

## 2016-05-02 ENCOUNTER — Telehealth: Payer: Self-pay | Admitting: *Deleted

## 2016-05-02 NOTE — Telephone Encounter (Signed)
  Follow up Call-  Call back number 04/29/2016  Post procedure Call Back phone  # [phone]  Permission to leave phone message Yes  Some recent data might be hidden     Patient questions:  Do you have a fever, pain , or abdominal swelling? No. Pain Score  0 *  Have you tolerated food without any problems? Yes.    Have you been able to return to your normal activities? Yes.    Do you have any questions about your discharge instructions: Diet   No. Medications  No. Follow up visit  No.  Do you have questions or concerns about your Care? No.  Actions: * If pain score is 4 or above: No action needed, pain <4.

## 2016-06-23 ENCOUNTER — Ambulatory Visit (INDEPENDENT_AMBULATORY_CARE_PROVIDER_SITE_OTHER): Payer: 59 | Admitting: Neurology

## 2016-06-23 ENCOUNTER — Other Ambulatory Visit: Payer: Self-pay | Admitting: Neurology

## 2016-06-23 ENCOUNTER — Encounter: Payer: Self-pay | Admitting: Neurology

## 2016-06-23 VITALS — BP 145/90 | HR 72 | Ht 64.0 in | Wt 239.0 lb

## 2016-06-23 DIAGNOSIS — R42 Dizziness and giddiness: Secondary | ICD-10-CM | POA: Diagnosis not present

## 2016-06-23 MED ORDER — TOPIRAMATE 25 MG PO TABS: ORAL_TABLET | ORAL | 3 refills | Status: DC

## 2016-06-23 NOTE — Progress Notes (Signed)
Reason for visit: Vertigo  Referring physician: Dr. Kerin SalenWolicke  Mercedes Robertson is a 51 y.o. female  History of present illness:  Mercedes Robertson is a 51 year old right-handed white female with a history of migraine headaches throughout her life. The patient claims that her headaches have become less of a problem over the last 3 or 4 years, however. The patient does have daily headaches that are in the front of the head that seem to respond to Zyrtec for allergies. The patient denies any other allergy type symptoms such as runny nose or redness or scratchiness of the eyes. The patient has had a 3 or 4 year history of vertigo that seems to be positional in nature. The patient can bring on the vertigo by rolling on the right side, or looking up. The patient will feel better if she stays still for about 30 seconds. The patient may have episodes that are quite severe and may be associated with some nausea and vomiting. The patient may note that sleep will help the episodes of vertigo. She denies any correlation of the headache with her vertigo. She denies any ear pain, ringing in the ears, or hearing changes. She denies any significant neck discomfort. She has not had any numbness or weakness of the face, arms, or legs. She denies any significant balance issues or difficulty controlling the bowels or the bladder. She has undergone MRI of the brain recently and this was unremarkable. She is sent to this office for an evaluation. She does the Epley maneuvers with some benefit with the severe episodes of vertigo. Of note, her sister also has vertigo.  Past Medical History:  Diagnosis Date  . Allergy   . Hypertension   . Vertigo     Past Surgical History:  Procedure Laterality Date  . FRACTURE SURGERY  2006   right ankle  . MANDIBLE SURGERY  Around 1994   TMJ    Family History  Problem Relation Age of Onset  . Colon cancer Neg Hx   . Esophageal cancer Neg Hx   . Rectal cancer Neg Hx   . Stomach  cancer Neg Hx     Social history:  reports that she has never smoked. She has never used smokeless tobacco. She reports that she drinks alcohol. She reports that she does not use drugs.  Medications:  Prior to Admission medications   Medication Sig Start Date End Date Taking? Authorizing Provider  cetirizine (ZYRTEC) 10 MG tablet Take 10 mg by mouth daily. Patient uses this medication for allergy symptoms.   Yes Historical Provider, MD  lisinopril-hydrochlorothiazide (PRINZIDE,ZESTORETIC) 20-25 MG tablet Take 1 tablet by mouth daily.   Yes Historical Provider, MD  meclizine (ANTIVERT) 12.5 MG tablet Take 12.5 mg by mouth daily as needed. 03/25/15  Yes Historical Provider, MD     No Known Allergies  ROS:  Out of a complete 14 system review of symptoms, the patient complains only of the following symptoms, and all other reviewed systems are negative.  Fatigue Dizziness Snoring Allergies  Blood pressure (!) 145/90, pulse 72, height 5\' 4"  (1.626 m), weight 239 lb (108.4 kg), SpO2 97 %.  Physical Exam  General: The patient is alert and cooperative at the time of the examination. The patient is markedly obese.  Eyes: Pupils are equal, round, and reactive to light. Discs are flat bilaterally.  Ears: Tympanic membranes are clear on the right, there is a wax plug on the left.  Neck: The neck is supple,  no carotid bruits are noted.  Respiratory: The respiratory examination is clear.  Cardiovascular: The cardiovascular examination reveals a regular rate and rhythm, no obvious murmurs or rubs are noted.  Skin: Extremities are without significant edema.  Neurologic Exam  Mental status: The patient is alert and oriented x 3 at the time of the examination. The patient has apparent normal recent and remote memory, with an apparently normal attention span and concentration ability.  Cranial nerves: Facial symmetry is present. There is good sensation of the face to pinprick and soft touch  bilaterally. The strength of the facial muscles and the muscles to head turning and shoulder shrug are normal bilaterally. Speech is well enunciated, no aphasia or dysarthria is noted. Extraocular movements are full. Visual fields are full. The tongue is midline, and the patient has symmetric elevation of the soft palate. No obvious hearing deficits are noted.  Motor: The motor testing reveals 5 over 5 strength of all 4 extremities. Good symmetric motor tone is noted throughout.  Sensory: Sensory testing is intact to pinprick, soft touch, vibration sensation, and position sense on all 4 extremities. No evidence of extinction is noted.  Coordination: Cerebellar testing reveals good finger-nose-finger and heel-to-shin bilaterally. The Nyan-Barrany procedure was notable for onset of vertigo and nystagmus when her head was turned to the left, the nystagmus is more prominent when gazing to the left with a rotatory component.  Gait and station: Gait is normal. Tandem gait is normal. Romberg is negative. No drift is seen.  Reflexes: Deep tendon reflexes are symmetric and normal bilaterally. Toes are downgoing bilaterally.   MRI brain 04/19/16:  IMPRESSION: 1. 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 2. Stable and normal MRI appearance of the brain. 3. Symmetric appearing nasal cavity mucosal thickening raising the possibility of rhinitis but the Paranasal sinuses are clear.  * MRI scan images were reviewed online. I agree with the written report.    Assessment/Plan:  1. Positional vertigo  2. Migraine headache  The patient appears to have a true vestibular issue, she has positional vertigo and nystagmus that correlates with this. She also has migraine headaches as well. The patient will be placed on Topamax, she will be set  up for vestibular rehabilitation to determine if any other vestibular maneuver may be of benefit for her vertigo. The patient will follow-up in about 4 months.  Marlan Palau MD 06/23/2016 3:10 PM  [facility] [address] [place], [place] [address]  Phone [phone] Fax [phone]

## 2016-06-23 NOTE — Patient Instructions (Signed)
   We will go on Topamax for the headache and get physical therapy for the vertigo.  Topamax (topiramate) is a seizure medication that has an FDA approval for seizures and for migraine headache. Potential side effects of this medication include weight loss, cognitive slowing, tingling in the fingers and toes, and carbonated drinks will taste bad. If any significant side effects are noted on this drug, please contact our office.

## 2016-07-28 ENCOUNTER — Ambulatory Visit: Payer: 59 | Attending: Neurology | Admitting: Physical Therapy

## 2016-07-28 DIAGNOSIS — R42 Dizziness and giddiness: Secondary | ICD-10-CM

## 2016-07-28 DIAGNOSIS — H8112 Benign paroxysmal vertigo, left ear: Secondary | ICD-10-CM | POA: Insufficient documentation

## 2016-07-28 NOTE — Therapy (Signed)
[facility] [address] [place], [place], [address] Phone: [phone]   Fax:  [phone]  Physical Therapy Evaluation  Patient Details  Name: Mercedes Robertson MRN: [id number] Date of Birth: 01/27/1966 Referring Provider: Dr. Anne Hahn  Encounter Date: 07/28/2016      PT End of Session - 07/28/16 1612    Visit Number 1   Number of Visits 6   Date for PT Re-Evaluation 09/08/16   Authorization Type [employer] - 20 visit limit   PT Start Time 1531   PT Stop Time 1605   PT Time Calculation (min) 34 min   Activity Tolerance Patient tolerated treatment well   Behavior During Therapy [facility] for tasks assessed/performed      Past Medical History:  Diagnosis Date  . Allergy   . Hypertension   . Vertigo     Past Surgical History:  Procedure Laterality Date  . FRACTURE SURGERY  2006   right ankle  . MANDIBLE SURGERY  Around 1994   TMJ    There were no vitals filed for this visit.       Subjective Assessment - 07/28/16 1533    Subjective Pt is a 51 y/o female who presents to OPPT for vertigo x "years."  Pt also reports episode of "full on vertigo" but is more concerned about positional vertigo.  Pt states Lt side more involved, but frequently switches from Rt to Lt.  Episodes of intense vertigo happen a couple times a year and possibly triggered by stress and motion.   Pertinent History depression, migraines   Patient Stated Goals eliminate dizziness   Currently in Pain? No/denies            [facility] PT Assessment - 07/28/16 1535      Assessment   Medical Diagnosis BPPV   Referring Provider Dr. Anne Hahn   Onset Date/Surgical Date --  years ago   Next MD Visit PRN   Prior Therapy n/a-has tried maneuvers at home     Precautions   Precautions None     Restrictions   Weight Bearing Restrictions No     Balance Screen   Has the patient fallen in the past 6 months Yes   How many times? 1   Has the patient had a  decrease in activity level because of a fear of falling?  No   Is the patient reluctant to leave their home because of a fear of falling?  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, some bending   Leisure unable currently to exercise     Cognition   Overall Cognitive Status Within Functional Limits for tasks assessed     Observation/Other Assessments   Focus on Therapeutic Outcomes (FOTO)  81 (19% limited; predicted 16% limited)   Dizziness Handicap Inventory (DHI)  30 (mild)     Ambulation/Gait   Gait Comments pt independent with mobility  Vestibular Assessment - 07/28/16 1540      Vestibular Assessment   General Observation no symptoms at rest     Symptom Behavior   Type of Dizziness Spinning   Frequency of Dizziness everytime pt lies back, rolls over   Duration of Dizziness < 1 min   Aggravating Factors Rolling to left;Rolling to right;Lying supine;Looking up to the ceiling;Sitting with head tilted back;Forward bending  tilting head   Relieving Factors Lying supine;Comments  waits for everything to settle     Occulomotor Exam   Occulomotor Alignment Normal   Spontaneous Absent   Gaze-induced Absent   Smooth Pursuits Intact   Saccades Intact   Comment HIT positive to Lt     Vestibulo-Occular Reflex   VOR 1 Head Only (x 1 viewing) WNL     Positional Testing   Dix-Hallpike Dix-Hallpike Right;Dix-Hallpike Left   Horizontal Canal Testing --     Dix-Hallpike  Right   Dix-Hallpike Right Duration none   Dix-Hallpike Right Symptoms No nystagmus     Dix-Hallpike Left   Dix-Hallpike Left Duration 20 sec   Dix-Hallpike Left Symptoms Upbeat, left rotatory nystagmus                Vestibular Treatment/Exercise - 07/28/16 1611      Vestibular Treatment/Exercise   Vestibular Treatment Provided Canalith Repositioning   Canalith Repositioning Epley Manuever Left      EPLEY MANUEVER LEFT   Number of Reps  2   Overall Response  Improved Symptoms    RESPONSE DETAILS LEFT increased latency with decreased speed of nystagmus on 2nd rep; 1st position- ?possible cupulolithiasis               PT Education - 07/28/16 1612    Education provided Yes   Education Details BPPV, vestibular migraines   Person(s) Educated Patient   Methods Explanation;Handout   Comprehension Verbalized understanding             PT Long Term Goals - 07/28/16 1618      PT LONG TERM GOAL #1   Title demonstrate negative positional testing (09/08/16)   Time 6   Period Weeks   Status New     PT LONG TERM GOAL #2   Title independent with HEP (09/08/16)   Time 6   Period Weeks   Status New               Plan - 07/28/16 1614    Clinical Impression Statement Pt is a 51 y/o female who presents to OPPT for low complexity PT eval for Lt pBPPV.  Pt with improved symptoms on 2nd rep of epley's but noted decreased speed of nystagmus which may indicate cupulolithiasis.  Will plan to address and tx as needed at next session.  Pt also expresses intermittent episodes of vertigo "attacks" which are not positional in nature.  Discussed possibility of vestibular migraine and pt reports she has discussed with neurologist and is trying medical intervention.  Will benefit from PT to address deficits listed.   Rehab Potential Excellent   PT Frequency 1x / week   PT Duration 6 weeks   PT Treatment/Interventions Canalith Repostioning;Neuromuscular re-education;Balance  training;ADLs/Self Care Home Management;Patient/family education;Vestibular   PT Next Visit Plan reassess, check horizontal, give brandt daroff and rolling for HEP as pt already with multiple occurrences of BPPV   Consulted and Agree with Plan of Care Patient      Patient will benefit from skilled therapeutic intervention in order to improve  the following deficits and impairments:  Dizziness  Visit Diagnosis: BPPV (benign paroxysmal positional vertigo), left - Plan: PT plan of care cert/re-cert  Dizziness and giddiness - Plan: PT plan of care cert/re-cert     Problem List Patient Active Problem List   Diagnosis Date Noted  . VERTIGO 05/08/2007  . HEADACHE 05/08/2007  . CHEST PAIN 01/03/2007  . DEPRESSION 06/11/2006  . Common migraine 06/11/2006  . ALLERGIC RHINITIS 06/11/2006      Clarita Crane, PT, DPT 07/28/16 4:20 PM    [facility] [address] [place], [place], [address] Phone: [phone]   Fax:  [phone]  Name: REJOICE HEATWOLE MRN: [id number] Date of Birth: 03-28-1966

## 2016-08-03 ENCOUNTER — Ambulatory Visit: Payer: 59

## 2016-08-03 DIAGNOSIS — R42 Dizziness and giddiness: Secondary | ICD-10-CM

## 2016-08-03 DIAGNOSIS — H8112 Benign paroxysmal vertigo, left ear: Secondary | ICD-10-CM | POA: Diagnosis not present

## 2016-08-03 NOTE — Therapy (Signed)
[facility] [address] [place], [place], [address] Phone: [phone]   Fax:  [phone]  Physical Therapy Treatment  Patient Details  Name: Mercedes Robertson MRN: [id number] Date of Birth: [birth year] Referring Provider: Dr. Anne Hahn  Encounter Date: 08/03/2016      PT End of Session - 08/03/16 0944    Visit Number 2   Number of Visits 6   Date for PT Re-Evaluation 09/08/16   Authorization Type U[employer] - 20 visit limit   Authorization - Visit Number 2   Authorization - Number of Visits 20   PT Start Time 0808   PT Stop Time 0846   PT Time Calculation (min) 38 min   Activity Tolerance Patient tolerated treatment well   Behavior During Therapy [facility] for tasks assessed/performed      Past Medical History:  Diagnosis Date  . Allergy   . Hypertension   . Vertigo     Past Surgical History:  Procedure Laterality Date  . FRACTURE SURGERY  2006   right ankle  . MANDIBLE SURGERY  Around 1994   TMJ    There were no vitals filed for this visit.      Subjective Assessment - 08/03/16 0808    Subjective Pt reports she feels much better, but she still has a little bit of dizziness when she lies back or rolls to the Robertson side.    Pertinent History depression, migraines   Patient Stated Goals eliminate dizziness   Currently in Pain? No/denies                Vestibular Assessment - 08/03/16 0810      Dix-Hallpike Right   Dix-Hallpike Right Duration none   Dix-Hallpike Right Symptoms No nystagmus     Dix-Hallpike Left   Dix-Hallpike Left Duration 10 sec. Pt required time to allow wooziness to subside after assessment and treatment.    Dix-Hallpike Left Symptoms Upbeat, left rotatory nystagmus     Horizontal Canal Right   Horizontal Canal Right Duration none   Horizontal Canal Right Symptoms Normal     Horizontal Canal Left   Horizontal Canal Left Duration pt reports slight wooziness but not spinning   Horizontal Canal Left Symptoms Normal                  Vestibular Treatment/Exercise - 08/03/16 0818      Vestibular Treatment/Exercise   Vestibular Treatment Provided Canalith Repositioning   Canalith Repositioning Epley Manuever Left   Habituation Exercises Brandt Daroff      EPLEY MANUEVER LEFT   Number of Reps  2   Overall Response  Improved Symptoms    RESPONSE DETAILS LEFT No nystagmus after treatment and pt denied dizziness after treatment.     Austin Miles   Number of Reps  3   Symptom Description  Pt reported slight wooziness upon sitting upright but no nystagmus noted and pt denied dizziness (spinning sensation). See pt instructions for details, added to HEP.               PT Education - 08/03/16 0944    Education provided Yes   Education Details exam findings and habituation HEP   Person(s) Educated Patient   Methods Explanation;Handout;Verbal cues;Demonstration   Comprehension Verbalized understanding;Returned demonstration             PT Long Term Goals - 07/28/16 1618      PT LONG TERM GOAL #1   Title demonstrate negative positional  testing (09/08/16)   Time 6   Period Weeks   Status New     PT LONG TERM GOAL #2   Title independent with HEP (09/08/16)   Time 6   Period Weeks   Status New               Plan - 08/03/16 0946    Clinical Impression Statement Pt again experienced s/s consistent with Robertson pBPPV, with intensity and nystagmus decreasing after first treatment and then resolving. Latency of dizziness and nystagmus was not delayed after first treatment. Pt demonstrated progress, as dizziness and nystagmus resolved after 2 Robertson Epley treatments. PT provided pt with habituation HEP, as pt has hx of vertigo. Continue with POC, pt likely to d/c early if sx's remain resolved after today's session.   Rehab Potential Excellent   PT Frequency 1x / week   PT Duration 6 weeks   PT Treatment/Interventions Canalith  Repostioning;Neuromuscular re-education;Balance training;ADLs/Self Care Home Management;Patient/family education;Vestibular   PT Next Visit Plan reassess, provide rolling for HEP and potential d/c    Consulted and Agree with Plan of Care Patient      Patient will benefit from skilled therapeutic intervention in order to improve the following deficits and impairments:  Dizziness  Visit Diagnosis: BPPV (benign paroxysmal positional vertigo), left  Dizziness and giddiness     Problem List Patient Active Problem List   Diagnosis Date Noted  . VERTIGO 05/08/2007  . HEADACHE 05/08/2007  . CHEST PAIN 01/03/2007  . DEPRESSION 06/11/2006  . Common migraine 06/11/2006  . ALLERGIC RHINITIS 06/11/2006    Mercedes Robertson,Mercedes Robertson 08/03/2016, 9:52 AM  [facility] [address] [place], [place], [address] Phone: [phone]   Fax:  [phone]  Name: Mercedes Robertson MRN: [id number] Date of Birth: [birth year]  Kynnadi Dicenso, PT,DPT 08/03/16 9:53 AM Phone: [phone] Fax: [phone]

## 2016-08-03 NOTE — Patient Instructions (Addendum)
Sit to Side-Lying    Sit on edge of bed. 1. Turn head 45 to right. 2. Maintain head position and lie down slowly on left side. Hold until symptoms subside plus 30 seconds. 3. Sit up slowly. Hold until symptoms subside. 4. Turn head 45 to left. 5. Maintain head position and lie down slowly on right side. Hold until symptoms subside plus 30 seconds. 6. Sit up slowly. Repeat sequence __3-5__ times per session. Do __1__ sessions per day.  Copyright  VHI. All rights reserved.   

## 2016-08-10 ENCOUNTER — Ambulatory Visit: Payer: 59

## 2016-10-27 ENCOUNTER — Ambulatory Visit: Payer: 59 | Admitting: Neurology

## 2017-01-05 NOTE — Therapy (Signed)
[facility] [address] [place], [place], [address] Phone: [phone]   Fax:  [phone]  Patient Details  Name: Mercedes Robertson MRN: [id number] Date of Birth: [birth year] Referring Provider:  No ref. provider found  Encounter Date: 01/05/2017   PHYSICAL THERAPY DISCHARGE SUMMARY  Visits from Start of Care: 2  Current functional level related to goals / functional outcomes:  PT LONG TERM GOAL #1   Title demonstrate negative positional testing (09/08/16)   Time 6   Period Weeks   Status New       PT LONG TERM GOAL #2   Title independent with HEP (09/08/16)   Time 6   Period Weeks   Status New      Remaining deficits: Unknown, as pt did not return.   Education / Equipment: HEP  Plan: Patient agrees to discharge.  Patient goals were not met. Patient is being discharged due to not returning since the last visit.  ?????       Miller,Anabela L 01/05/2017, 11:38 AM  [place] [address] [place], [place], [address] Phone: [phone]   Fax:  [phone]    Macayla Ekdahl, PT,DPT 01/05/17 11:38 AM Phone: [phone] Fax: [phone]

## 2019-03-08 ENCOUNTER — Other Ambulatory Visit: Payer: Self-pay

## 2019-03-11 ENCOUNTER — Encounter: Payer: Self-pay | Admitting: Obstetrics & Gynecology

## 2019-03-11 ENCOUNTER — Ambulatory Visit (INDEPENDENT_AMBULATORY_CARE_PROVIDER_SITE_OTHER): Payer: Managed Care, Other (non HMO) | Admitting: Obstetrics & Gynecology

## 2019-03-11 ENCOUNTER — Other Ambulatory Visit: Payer: Self-pay

## 2019-03-11 VITALS — BP 130/82 | Ht 62.0 in | Wt 236.0 lb

## 2019-03-11 DIAGNOSIS — Z6841 Body Mass Index (BMI) 40.0 and over, adult: Secondary | ICD-10-CM

## 2019-03-11 DIAGNOSIS — Z780 Asymptomatic menopausal state: Secondary | ICD-10-CM

## 2019-03-11 DIAGNOSIS — Z1151 Encounter for screening for human papillomavirus (HPV): Secondary | ICD-10-CM

## 2019-03-11 DIAGNOSIS — Z01419 Encounter for gynecological examination (general) (routine) without abnormal findings: Secondary | ICD-10-CM | POA: Diagnosis not present

## 2019-03-11 NOTE — Progress Notes (Signed)
Mercedes Robertson 01-Nov-1965 [id number]   History:    53 y.o. G2P2L2 Married/Separated.  Daughter 36 yo, son 41 yo.  RP:  New patient presenting for annual gyn exam   HPI: Postmenopause x 2-3 yrs, well on no HRT.  No PMB.  No pelvic pain.  Abstinent.  Breasts normal.  Urine/BMs normal.  C/O difficulty sleeping, snoring/dry mouth, drinking more water, then passing urine frequently.  Previous sleeping study with no sleep apnea.  BMI 43.10.  Not very active physically.  Health labs with Fam MD.  Past medical history,surgical history, family history and social history were all reviewed and documented in the EPIC chart.  Gynecologic History Patient's last menstrual period was 03/10/2017. Contraception: post menopausal status Last Pap: 2016. Results were: normal Last mammogram: 10/2018. Results were: Negative Bone Density: Normal about 5 yrs ago Colonoscopy: 2018  Obstetric History OB History  Gravida Para Term Preterm AB Living  2 2       2   SAB TAB Ectopic Multiple Live Births               # Outcome Date GA Lbr Len/2nd Weight Sex Delivery Anes PTL Lv  2 Para           1 Para              ROS: A ROS was performed and pertinent positives and negatives are included in the history.  GENERAL: No fevers or chills. HEENT: No change in vision, no earache, sore throat or sinus congestion. NECK: No pain or stiffness. CARDIOVASCULAR: No chest pain or pressure. No palpitations. PULMONARY: No shortness of breath, cough or wheeze. GASTROINTESTINAL: No abdominal pain, nausea, vomiting or diarrhea, melena or bright red blood per rectum. GENITOURINARY: No urinary frequency, urgency, hesitancy or dysuria. MUSCULOSKELETAL: No joint or muscle pain, no back pain, no recent trauma. DERMATOLOGIC: No rash, no itching, no lesions. ENDOCRINE: No polyuria, polydipsia, no heat or cold intolerance. No recent change in weight. HEMATOLOGICAL: No anemia or easy bruising or bleeding. NEUROLOGIC: No headache,  seizures, numbness, tingling or weakness. PSYCHIATRIC: No depression, no loss of interest in normal activity or change in sleep pattern.     Exam:   BP 130/82   Ht 5\' 2"  (1.575 m)   Wt 236 lb (107 kg)   LMP 03/10/2017   BMI 43.16 kg/m   Body mass index is 43.16 kg/m.  General appearance : Well developed well nourished female. No acute distress HEENT: Eyes: no retinal hemorrhage or exudates,  Neck supple, trachea midline, no carotid bruits, no thyroidmegaly Lungs: Clear to auscultation, no rhonchi or wheezes, or rib retractions  Heart: Regular rate and rhythm, no murmurs or gallops Breast:Examined in sitting and supine position were symmetrical in appearance, no palpable masses or tenderness,  no skin retraction, no nipple inversion, no nipple discharge, no skin discoloration, no axillary or supraclavicular lymphadenopathy Abdomen: no palpable masses or tenderness, no rebound or guarding Extremities: no edema or skin discoloration or tenderness  Pelvic: Vulva: Normal             Vagina: No gross lesions or discharge  Cervix: No gross lesions or discharge.  Pap/HPV HR done.  Uterus  AV, normal size, shape and consistency, non-tender and mobile  Adnexa  Without masses or tenderness  Anus: Normal   Assessment/Plan:  53 y.o. female for annual exam   1. Encounter for routine gynecological examination with Papanicolaou smear of cervix Normal gynecologic exam.  Pap with high-risk HPV  done today.  Breast exam normal.  Screening mammogram July 2020 was negative.  Health labs with family physician.  Screening colonoscopy in 2018.   2. Postmenopause Well on no hormone replacement therapy.  No postmenopausal bleeding.  Had a normal bone density 5 years ago.  Recommend vitamin D supplements, calcium intake of 1200 mg daily and regular weightbearing physical activities.  3. Class 3 severe obesity due to excess calories without serious comorbidity with body mass index (BMI) of 40.0 to 44.9 in  adult [facility]) Recommend a lower calorie/carb diet such as [employer].  Aerobic physical activities 5 times a week and small weight lifting every 2 days.  Other orders - losartan (COZAAR) 25 MG tablet; Take 25 mg by mouth daily.  Genia Del MD, 3:31 PM 03/11/2019

## 2019-03-11 NOTE — Patient Instructions (Addendum)
1. Encounter for routine gynecological examination with Papanicolaou smear of cervix Normal gynecologic exam.  Pap with high-risk HPV done today.  Breast exam normal.  Screening mammogram July 2020 was negative.  Health labs with family physician.  Screening colonoscopy in 2018.   2. Postmenopause Well on no hormone replacement therapy.  No postmenopausal bleeding.  Had a normal bone density 5 years ago.  Recommend vitamin D supplements, calcium intake of 1200 mg daily and regular weightbearing physical activities.  3. Class 3 severe obesity due to excess calories without serious comorbidity with body mass index (BMI) of 40.0 to 44.9 in adult [facility]) Recommend a lower calorie/carb diet such as [employer].  Aerobic physical activities 5 times a week and small weight lifting every 2 days.  Other orders - losartan (COZAAR) 25 MG tablet; Take 25 mg by mouth daily.  Mercedes Robertson, it was a pleasure meeting you today!  I will inform you of your results as soon as they are available.

## 2019-03-13 LAB — PAP, TP IMAGING W/ HPV RNA, RFLX HPV TYPE 16,18/45: HPV DNA High Risk: NOT DETECTED

## 2019-09-05 ENCOUNTER — Ambulatory Visit: Payer: Self-pay | Attending: Internal Medicine

## 2019-09-05 DIAGNOSIS — Z23 Encounter for immunization: Secondary | ICD-10-CM

## 2019-09-05 NOTE — Progress Notes (Signed)
   Covid-19 Vaccination Clinic  Name:  FELISHA CLAYTOR    MRN: [id number] DOB: 03/02/1966  09/05/2019  Ms. Pat was observed post Covid-19 immunization for 15 minutes without incident. She was provided with Vaccine Information Sheet and instruction to access the V-Safe system.   Ms. Armentor was instructed to call 911 with any severe reactions post vaccine: Marland Kitchen Difficulty breathing  . Swelling of face and throat  . A fast heartbeat  . A bad rash all over body  . Dizziness and weakness   Immunizations Administered    Name Date Dose VIS Date Route   Pfizer COVID-19 Vaccine 09/05/2019  3:47 PM 0.3 mL 06/12/2018 Intramuscular   Manufacturer: [employer], [employer]   Lot: [id number]   NDC: [address]-3

## 2019-09-30 ENCOUNTER — Ambulatory Visit: Payer: Self-pay

## 2019-10-05 ENCOUNTER — Ambulatory Visit: Payer: Self-pay | Attending: Internal Medicine

## 2020-09-06 ENCOUNTER — Emergency Department (HOSPITAL_BASED_OUTPATIENT_CLINIC_OR_DEPARTMENT_OTHER)
Admission: EM | Admit: 2020-09-06 | Discharge: 2020-09-06 | Disposition: A | Discharge status: Home / Self Care | Payer: BC Managed Care – PPO | Attending: Emergency Medicine | Admitting: Emergency Medicine

## 2020-09-06 ENCOUNTER — Emergency Department (HOSPITAL_BASED_OUTPATIENT_CLINIC_OR_DEPARTMENT_OTHER): Payer: BC Managed Care – PPO

## 2020-09-06 ENCOUNTER — Other Ambulatory Visit: Payer: Self-pay

## 2020-09-06 ENCOUNTER — Encounter (HOSPITAL_BASED_OUTPATIENT_CLINIC_OR_DEPARTMENT_OTHER): Payer: Self-pay | Admitting: Emergency Medicine

## 2020-09-06 DIAGNOSIS — Z79899 Other long term (current) drug therapy: Secondary | ICD-10-CM | POA: Insufficient documentation

## 2020-09-06 DIAGNOSIS — R079 Chest pain, unspecified: Secondary | ICD-10-CM

## 2020-09-06 DIAGNOSIS — R072 Precordial pain: Secondary | ICD-10-CM | POA: Insufficient documentation

## 2020-09-06 DIAGNOSIS — I10 Essential (primary) hypertension: Secondary | ICD-10-CM | POA: Diagnosis not present

## 2020-09-06 LAB — BASIC METABOLIC PANEL
Anion gap: 9 (ref 5–15)
BUN: 12 mg/dL (ref 6–20)
CO2: 24 mmol/L (ref 22–32)
Calcium: 9.1 mg/dL (ref 8.9–10.3)
Chloride: 105 mmol/L (ref 98–111)
Creatinine, Ser: 0.63 mg/dL (ref 0.44–1.00)
GFR, Estimated: >60 mL/min (ref >60)
Glucose, Bld: 108 mg/dL — ABNORMAL HIGH (ref 70–99)
Potassium: 3.2 mmol/L — ABNORMAL LOW (ref 3.5–5.1)
Sodium: 138 mmol/L (ref 135–145)

## 2020-09-06 LAB — CBC
HCT: 39.7 % (ref 36.0–46.0)
Hemoglobin: 13.1 g/dL (ref 12.0–15.0)
MCH: 28.9 pg (ref 26.0–34.0)
MCHC: 33 g/dL (ref 30.0–36.0)
MCV: 87.4 fL (ref 80.0–100.0)
Platelets: 244 10*3/uL (ref 150–400)
RBC: 4.54 MIL/uL (ref 3.87–5.11)
RDW: 14 % (ref 11.5–15.5)
WBC: 6.8 10*3/uL (ref 4.0–10.5)
nRBC: 0 % (ref 0.0–0.2)

## 2020-09-06 LAB — TROPONIN I (HIGH SENSITIVITY)
Troponin I (High Sensitivity): 2 ng/L (ref <18)
Troponin I (High Sensitivity): 3 ng/L (ref <18)

## 2020-09-06 MED ORDER — POTASSIUM CHLORIDE CRYS ER 20 MEQ PO TBCR
40.0000 meq | EXTENDED_RELEASE_TABLET | Freq: Once | ORAL | Status: AC
  Administered 2020-09-06: 40 meq via ORAL
  Filled 2020-09-06: qty 2

## 2020-09-06 NOTE — ED Triage Notes (Signed)
Reports central chest pain described as sharp that started tonight while cooking dinner.  Also endorses nausea and lightheaded.

## 2020-09-06 NOTE — Discharge Instructions (Signed)
Take 4 over the counter ibuprofen tablets 3 times a day or 2 over-the-counter naproxen tablets twice a day for pain. Also take tylenol 1000mg(2 extra strength) four times a day.    Follow up with your family doc.    

## 2020-09-06 NOTE — ED Provider Notes (Signed)
[facility] EMERGENCY DEPARTMENT Provider Note   CSN: [id number] Arrival date & time: 09/06/20  1906     History Chief Complaint  Patient presents with  . Chest Pain    Mercedes Robertson is a 55 y.o. female.  55 yo F with a chief complaint of chest pain.  This is pinpoint and started while she was making dinner.  She felt a bit warm when it occurred.  She denies any difficulty breathing.  Nothing seemed to make it better or worse.  She thinks after she rested for a bit maybe it had improved.  She feels much better now.  She had spent the day moving boxes.  She denied any symptoms while actually moving.  Denies any recent symptoms with exercise.  Denies cough congestion or fever denies nausea vomiting or diarrhea.  Patient denies history of MI, has a hx of HTN.  Denies hyperlipidemia diabetes or smoking.  Denies family history of MI.  Patient denies history of PE or DVT denies hemoptysis denies unilateral lower extremity edema denies recent surgery immobilization hospitalization estrogen use or history of cancer.   The history is provided by the patient and a relative.  Chest Pain Pain location:  Substernal area Pain quality: aching and sharp   Pain radiates to:  Does not radiate Pain severity:  Moderate Onset quality:  Gradual Duration:  2 hours Timing:  Constant Progression:  Partially resolved Chronicity:  New Relieved by:  Nothing Worsened by:  Nothing Ineffective treatments:  None tried Associated symptoms: no dizziness, no fever, no headache, no nausea, no palpitations, no shortness of breath and no vomiting        Past Medical History:  Diagnosis Date  . Allergy   . Hypertension   . Vertigo     Patient Active Problem List   Diagnosis Date Noted  . VERTIGO 05/08/2007  . HEADACHE 05/08/2007  . CHEST PAIN 01/03/2007  . DEPRESSION 06/11/2006  . Common migraine 06/11/2006  . ALLERGIC RHINITIS 06/11/2006    Past Surgical History:  Procedure  Laterality Date  . FRACTURE SURGERY  2006   right ankle  . MANDIBLE SURGERY  Around 1994   TMJ     OB History    Gravida  2   Para  2   Term      Preterm      AB      Living  2     SAB      IAB      Ectopic      Multiple      Live Births              Family History  Problem Relation Age of Onset  . Hypertension Father   . Breast cancer Maternal Aunt   . Cancer Maternal Grandfather        prostate  . Colon cancer Neg Hx   . Esophageal cancer Neg Hx   . Rectal cancer Neg Hx   . Stomach cancer Neg Hx     Social History   Tobacco Use  . Smoking status: Never Smoker  . Smokeless tobacco: Never Used  Vaping Use  . Vaping Use: Never used  Substance Use Topics  . Alcohol use: Yes    Comment: occasionally  . Drug use: No    Home Medications Prior to Admission medications   Medication Sig Start Date End Date Taking? Authorizing Provider  cetirizine (ZYRTEC) 10 MG tablet Take 10 mg by mouth daily.  Patient uses this medication for allergy symptoms.    [provider]  losartan (COZAAR) 25 MG tablet Take 25 mg by mouth daily.    [provider]  meclizine (ANTIVERT) 12.5 MG tablet Take 12.5 mg by mouth daily as needed. 03/25/15   [provider]    Allergies    Lisinopril  Review of Systems   Review of Systems  Constitutional: Negative for chills and fever.  HENT: Negative for congestion and rhinorrhea.   Eyes: Negative for redness and visual disturbance.  Respiratory: Negative for shortness of breath and wheezing.   Cardiovascular: Positive for chest pain. Negative for palpitations.  Gastrointestinal: Negative for nausea and vomiting.  Genitourinary: Negative for dysuria and urgency.  Musculoskeletal: Negative for arthralgias and myalgias.  Skin: Negative for pallor and wound.  Neurological: Negative for dizziness and headaches.    Physical Exam Updated Vital Signs BP (!) 163/90   Pulse 65   Temp 98.4 F (36.9 C)  (Oral)   Resp 18   Ht 5\' 4"  (1.626 m)   Wt 99.8 kg   LMP 03/10/2017   SpO2 100%   BMI 37.76 kg/m   Physical Exam Vitals and nursing note reviewed.  Constitutional:      General: She is not in acute distress.    Appearance: She is well-developed. She is not diaphoretic.  HENT:     Head: Normocephalic and atraumatic.  Eyes:     Pupils: Pupils are equal, round, and reactive to light.  Cardiovascular:     Rate and Rhythm: Normal rate and regular rhythm.     Heart sounds: No murmur heard. No friction rub. No gallop.   Pulmonary:     Effort: Pulmonary effort is normal.     Breath sounds: No wheezing or rales.  Chest:     Chest wall: Tenderness present.     Comments: Palpation along the left sternal border about ribs 2 through 4 reproduce her pain. Abdominal:     General: There is no distension.     Palpations: Abdomen is soft.     Tenderness: There is no abdominal tenderness.  Musculoskeletal:        General: No tenderness.     Cervical back: Normal range of motion and neck supple.  Skin:    General: Skin is warm and dry.  Neurological:     Mental Status: She is alert and oriented to person, place, and time.  Psychiatric:        Behavior: Behavior normal.     ED Results / Procedures / Treatments   Labs (all labs ordered are listed, but only abnormal results are displayed) Labs Reviewed  BASIC METABOLIC PANEL - Abnormal; Notable for the following components:      Result Value   Potassium 3.2 (*)    Glucose, Bld 108 (*)    All other components within normal limits  CBC  TROPONIN I (HIGH SENSITIVITY)  TROPONIN I (HIGH SENSITIVITY)    EKG EKG Interpretation  Date/Time:  Sunday Sep 06 2020 19:22:52 EDT Ventricular Rate:  68 PR Interval:  152 QRS Duration: 86 QT Interval:  410 QTC Calculation: 435 R Axis:   12 Text Interpretation: Normal sinus rhythm Cannot rule out Anterior infarct , age undetermined Abnormal ECG No significant change since last tracing  Confirmed by 11-24-1997 [phone]) on 09/06/2020 7:49:52 PM   Radiology DG Chest 2 View  Result Date: 09/06/2020 CLINICAL DATA:  Chest pain. EXAM: CHEST - 2 VIEW COMPARISON:  May 16, 2004 FINDINGS: The heart size and mediastinal contours are within normal limits. Both lungs are clear. The visualized skeletal structures are unremarkable. IMPRESSION: No active cardiopulmonary disease. Electronically Signed   By: Aram Candela M.D.   On: 09/06/2020 20:20    Procedures Procedures   Medications Ordered in ED Medications  potassium chloride SA (KLOR-CON) CR tablet 40 mEq (40 mEq Oral Given 09/06/20 2135)    ED Course  I have reviewed the triage vital signs and the nursing notes.  Pertinent labs & imaging results that were available during my care of the patient were reviewed by me and considered in my medical decision making (see chart for details).    MDM Rules/Calculators/A&P                          55 yo F with a chief complaints of chest pain.  This is atypical in nature and reproduced on exam.  Patient's initial troponin is negative.  Her symptoms occurred just prior to arrival obtain a delta.  I feel her symptoms are completely atypical of pulmonary embolism and she has no risk factors and no further work-up is warranted.  Trop negative x 2.  D/c home.  PCP follow up.  11:02 PM:  I have discussed the diagnosis/risks/treatment options with the patient and believe the pt to be eligible for discharge home to follow-up with PCP. We also discussed returning to the ED immediately if new or worsening sx occur. We discussed the sx which are most concerning (e.g., sudden worsening pain, fever, inability to tolerate by mouth) that necessitate immediate return. Medications administered to the patient during their visit and any new prescriptions provided to the patient are listed below.  Medications given during this visit Medications  potassium chloride SA (KLOR-CON) CR tablet 40 mEq (40  mEq Oral Given 09/06/20 2135)     The patient appears reasonably screen and/or stabilized for discharge and I doubt any other medical condition or other [facility] requiring further screening, evaluation, or treatment in the ED at this time prior to discharge.   Final Clinical Impression(s) / ED Diagnoses Final diagnoses:  Nonspecific chest pain    Rx / DC Orders ED Discharge Orders    None       Melene Plan, DO 09/06/20 2302

## 2020-12-12 ENCOUNTER — Other Ambulatory Visit: Payer: Self-pay

## 2020-12-12 ENCOUNTER — Emergency Department (HOSPITAL_BASED_OUTPATIENT_CLINIC_OR_DEPARTMENT_OTHER): Payer: BC Managed Care – PPO

## 2020-12-12 ENCOUNTER — Emergency Department (HOSPITAL_BASED_OUTPATIENT_CLINIC_OR_DEPARTMENT_OTHER)
Admission: EM | Admit: 2020-12-12 | Discharge: 2020-12-12 | Disposition: A | Discharge status: Home / Self Care | Payer: BC Managed Care – PPO | Attending: Emergency Medicine | Admitting: Emergency Medicine

## 2020-12-12 ENCOUNTER — Encounter (HOSPITAL_BASED_OUTPATIENT_CLINIC_OR_DEPARTMENT_OTHER): Payer: Self-pay | Admitting: Emergency Medicine

## 2020-12-12 DIAGNOSIS — Z79899 Other long term (current) drug therapy: Secondary | ICD-10-CM | POA: Diagnosis not present

## 2020-12-12 DIAGNOSIS — S99922A Unspecified injury of left foot, initial encounter: Secondary | ICD-10-CM | POA: Diagnosis present

## 2020-12-12 DIAGNOSIS — W2209XA Striking against other stationary object, initial encounter: Secondary | ICD-10-CM | POA: Insufficient documentation

## 2020-12-12 DIAGNOSIS — S92515A Nondisplaced fracture of proximal phalanx of left lesser toe(s), initial encounter for closed fracture: Secondary | ICD-10-CM | POA: Diagnosis not present

## 2020-12-12 DIAGNOSIS — I10 Essential (primary) hypertension: Secondary | ICD-10-CM | POA: Insufficient documentation

## 2020-12-12 NOTE — ED Triage Notes (Signed)
Pt arrives pov with c/o L pinky toe injury pta. Pt reports hitting foot on box.

## 2020-12-12 NOTE — ED Provider Notes (Signed)
[facility] EMERGENCY DEPARTMENT Provider Note   CSN: [id number] Arrival date & time: 12/12/20  1623     History Chief Complaint  Patient presents with   Toe Injury    Mercedes Robertson is a 55 y.o. female.  Ran into a box injuring the left foot. Pain is located over the fifth toe and the lateral foot.  The history is provided by the patient.  Toe Pain This is a new problem. The current episode started 1 to 2 hours ago. The problem occurs constantly. The problem has not changed since onset.Pertinent negatives include no chest pain, no abdominal pain, no headaches and no shortness of breath. The symptoms are aggravated by walking. The symptoms are relieved by rest. She has tried a cold compress for the symptoms. The treatment provided mild relief.      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     OB History     Gravida  2   Para  2   Term      Preterm      AB      Living  2      SAB      IAB      Ectopic      Multiple      Live Births              Family History  Problem Relation Age of Onset   Hypertension Father    Breast cancer Maternal Aunt    Cancer Maternal Grandfather        prostate   Colon cancer Neg Hx    Esophageal cancer Neg Hx    Rectal cancer Neg Hx    Stomach cancer Neg Hx     Social History   Tobacco Use   Smoking status: Never   Smokeless tobacco: Never  Vaping Use   Vaping Use: Never used  Substance Use Topics   Alcohol use: Yes    Comment: occasionally   Drug use: No    Home Medications Prior to Admission medications    Medication Sig Start Date End Date Taking? Authorizing Provider  cetirizine (ZYRTEC) 10 MG tablet Take 10 mg by mouth daily. Patient uses this medication for allergy symptoms.    [provider]  losartan (COZAAR) 25 MG tablet Take 25 mg by mouth daily.    [provider]  meclizine (ANTIVERT) 12.5 MG tablet Take 12.5 mg by mouth daily as needed. 03/25/15   [provider]    Allergies    Lisinopril  Review of Systems   Review of Systems  Constitutional:  Negative for chills and fever.  HENT:  Negative for ear pain and sore throat.   Eyes:  Negative for pain and visual disturbance.  Respiratory:  Negative for cough and shortness of breath.   Cardiovascular:  Negative for chest pain and palpitations.  Gastrointestinal:  Negative for abdominal pain and vomiting.  Genitourinary:  Negative for dysuria and hematuria.  Musculoskeletal:  Negative for arthralgias and back  pain.  Skin:  Negative for color change and rash.  Neurological:  Negative for seizures, syncope and headaches.  All other systems reviewed and are negative.  Physical Exam Updated Vital Signs BP (!) 154/77 (BP Location: Right Arm)   Pulse 70   Temp 98.5 F (36.9 C) (Oral)   Resp 18   Ht 5\' 3"  (1.6 m)   Wt 99.8 kg   LMP 03/10/2017   SpO2 100%   BMI 38.97 kg/m   Physical Exam Vitals and nursing note reviewed.  HENT:     Head: Normocephalic and atraumatic.  Eyes:     General: No scleral icterus. Pulmonary:     Effort: Pulmonary effort is normal. No respiratory distress.  Musculoskeletal:     Cervical back: Normal range of motion.     Comments: Swelling of the left fifth toe with tenderness to palpation over the proximal phalanx.  Minimal tenderness to palpation throughout the fifth metatarsal.  Foot is neurovascularly intact.  No skin findings.  Skin:    General: Skin is warm and dry.  Neurological:     Mental Status: She is alert.  Psychiatric:        Mood and Affect: Mood  normal.    ED Results / Procedures / Treatments   Labs (all labs ordered are listed, but only abnormal results are displayed) Labs Reviewed - No data to display  EKG None  Radiology DG Foot Complete Left  Result Date: 12/12/2020 CLINICAL DATA:  Fifth left toe injury. EXAM: LEFT FOOT - COMPLETE 3+ VIEW COMPARISON:  None. FINDINGS: An acute fracture deformity is seen involving the proximal phalanx of the fifth left toe. Mild lateral angulation of the distal fracture site is noted. There is no evidence of dislocation. A chronic fracture of the fifth left metatarsal is also seen. Mild soft tissue swelling is seen adjacent to the previously noted fracture site. IMPRESSION: Acute fracture of the proximal phalanx of the fifth left toe. Electronically Signed   By: 12/14/2020 M.D.   On: 12/12/2020 16:58    Procedures Procedures   Medications Ordered in ED Medications - No data to display  ED Course  I have reviewed the triage vital signs and the nursing notes.  Pertinent labs & imaging results that were available during my care of the patient were reviewed by me and considered in my medical decision making (see chart for details).    MDM Rules/Calculators/A&P                           Jenascia Bumpass Lessner standing fracture of the proximal phalanx of the left fifth toe.  She was placed in a postop shoe.  She was given instructions regarding symptomatic management.  Return precautions were discussed. Final Clinical Impression(s) / ED Diagnoses Final diagnoses:  Closed nondisplaced fracture of proximal phalanx of lesser toe of left foot, initial encounter    Rx / DC Orders ED Discharge Orders     None        Garner Gavel, MD 12/12/20 [phone]

## 2020-12-12 NOTE — ED Notes (Signed)
Pt refused post op shoe, states she has one at home

## 2021-05-24 IMAGING — DX DG CHEST 2V
2 series · 2 of 2 positions shown · non-contrast
Comparison: May 16, 2004

CLINICAL DATA: Chest pain.

EXAM:
CHEST - 2 VIEW

[chest pa]
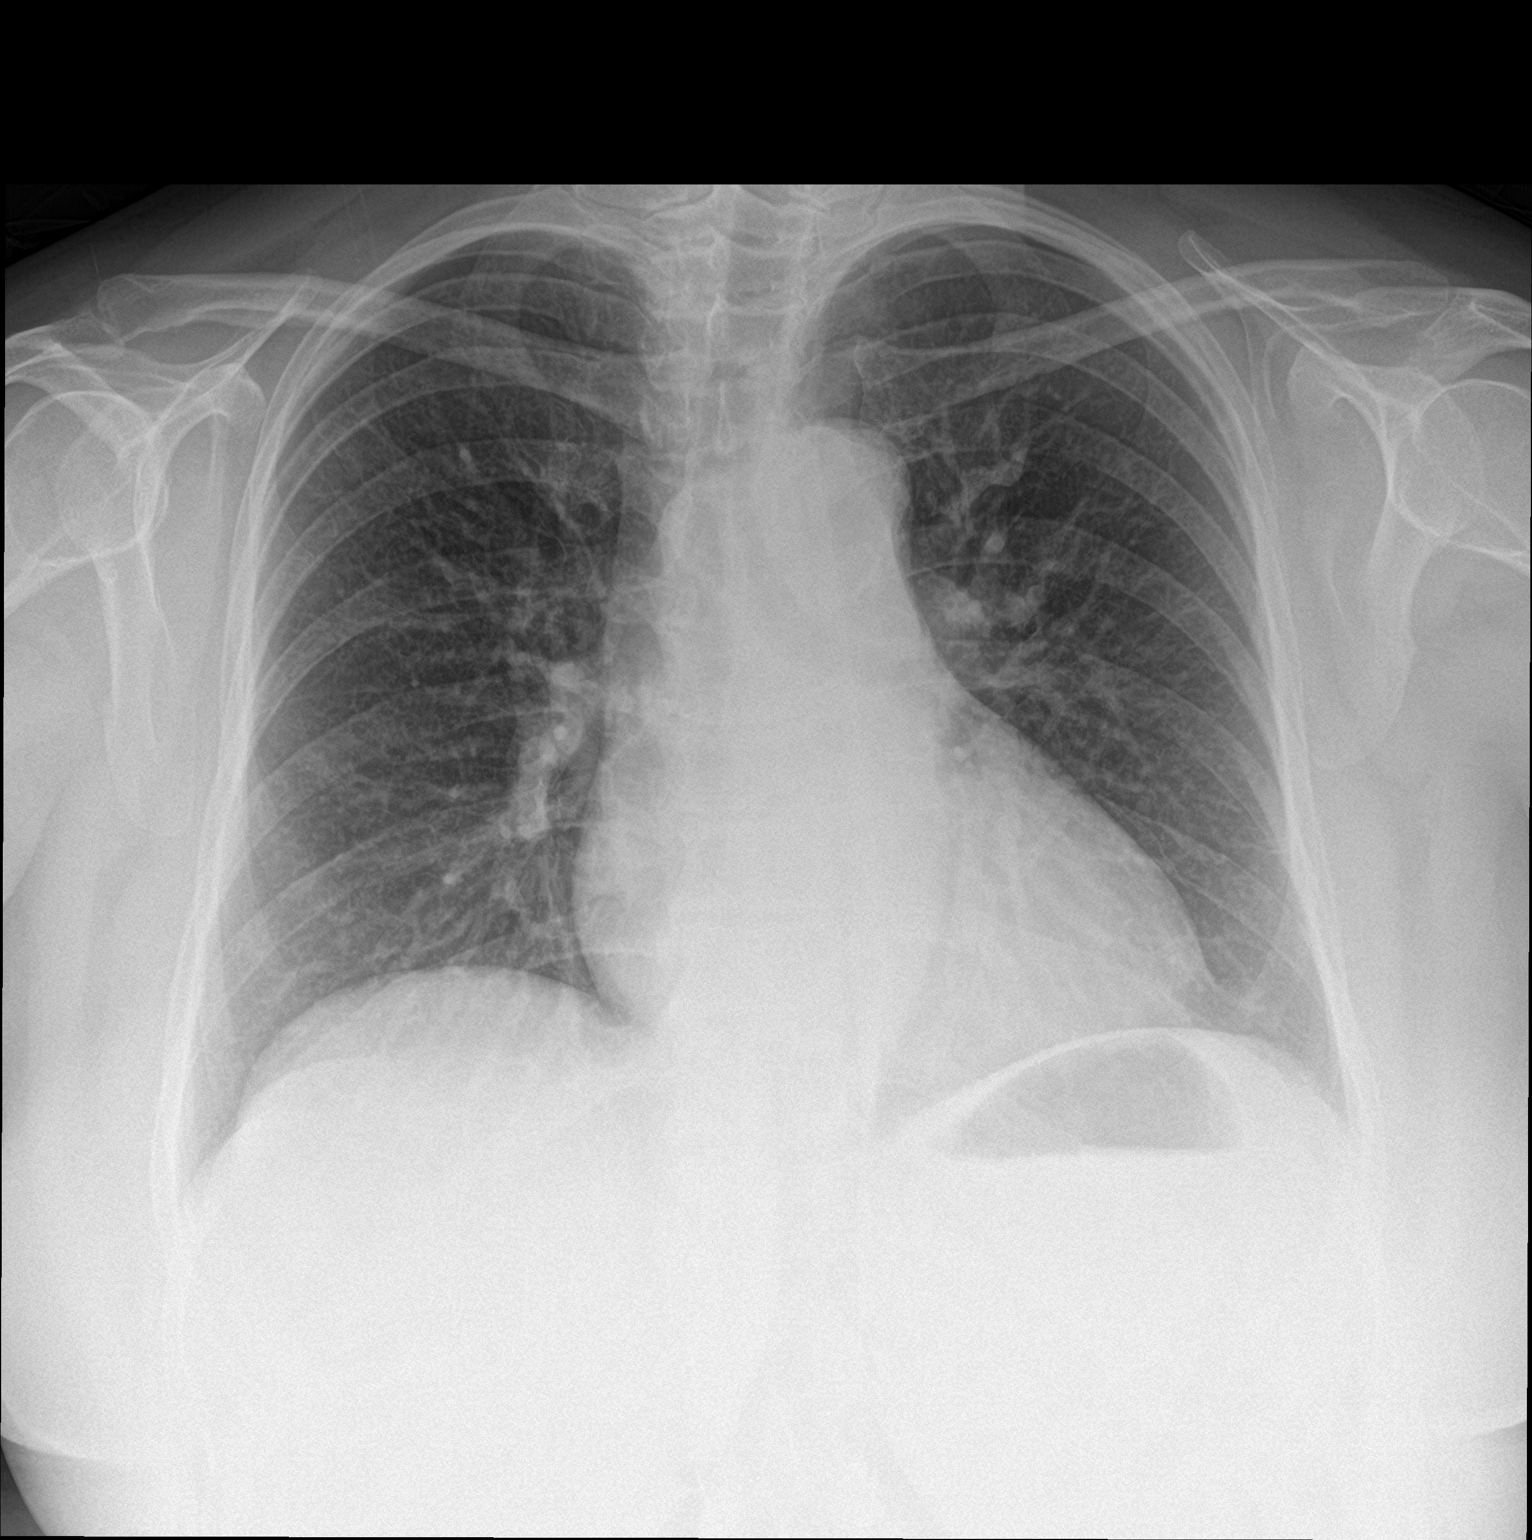

[chest lat]
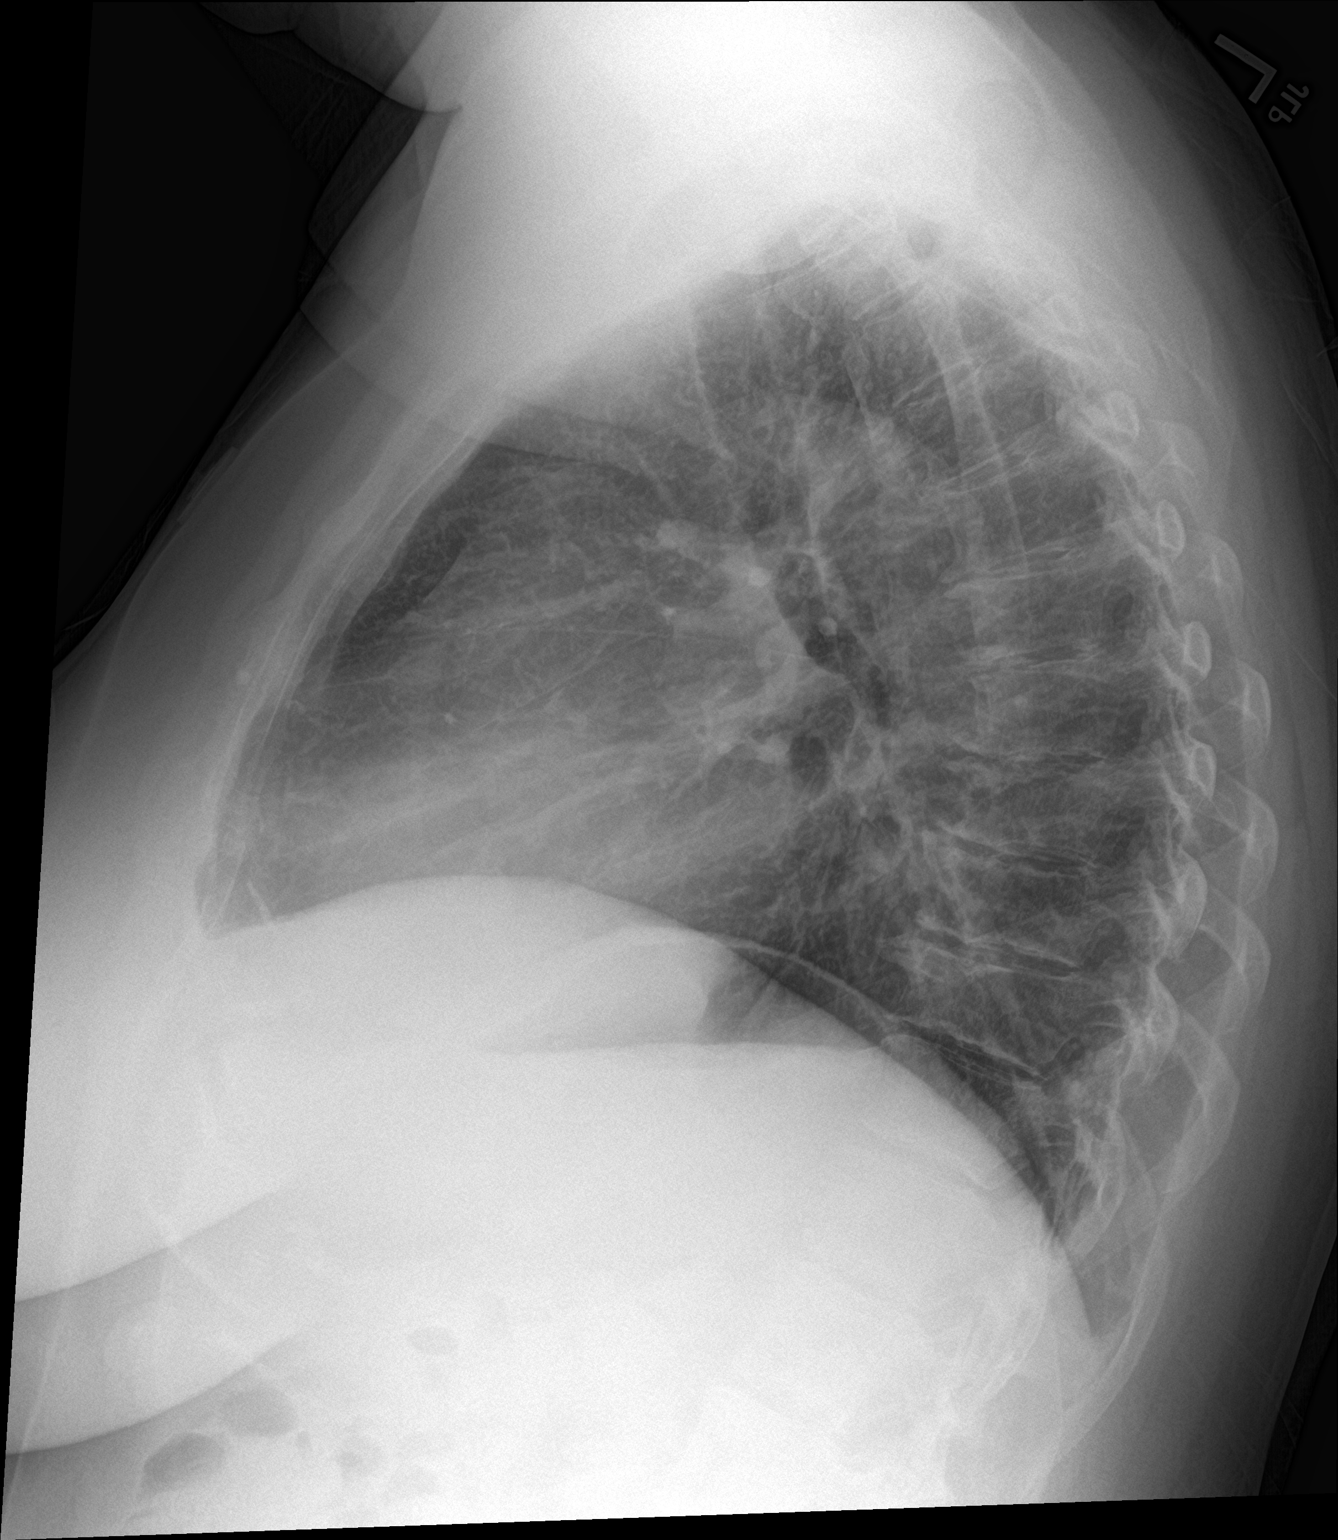

[2 of 2 positions shown; findings below may reference images not displayed]

FINDINGS: The heart size and mediastinal contours are within normal limits.
Both lungs are clear. The visualized skeletal structures are
unremarkable.
IMPRESSION: No active cardiopulmonary disease.

## 2021-08-29 IMAGING — CR DG FOOT COMPLETE 3+V*L*
3 series · 3 of 3 positions shown · non-contrast
Comparison: None.

CLINICAL DATA: Fifth left toe injury.

EXAM:
LEFT FOOT - COMPLETE 3+ VIEW

[t foot ap left]
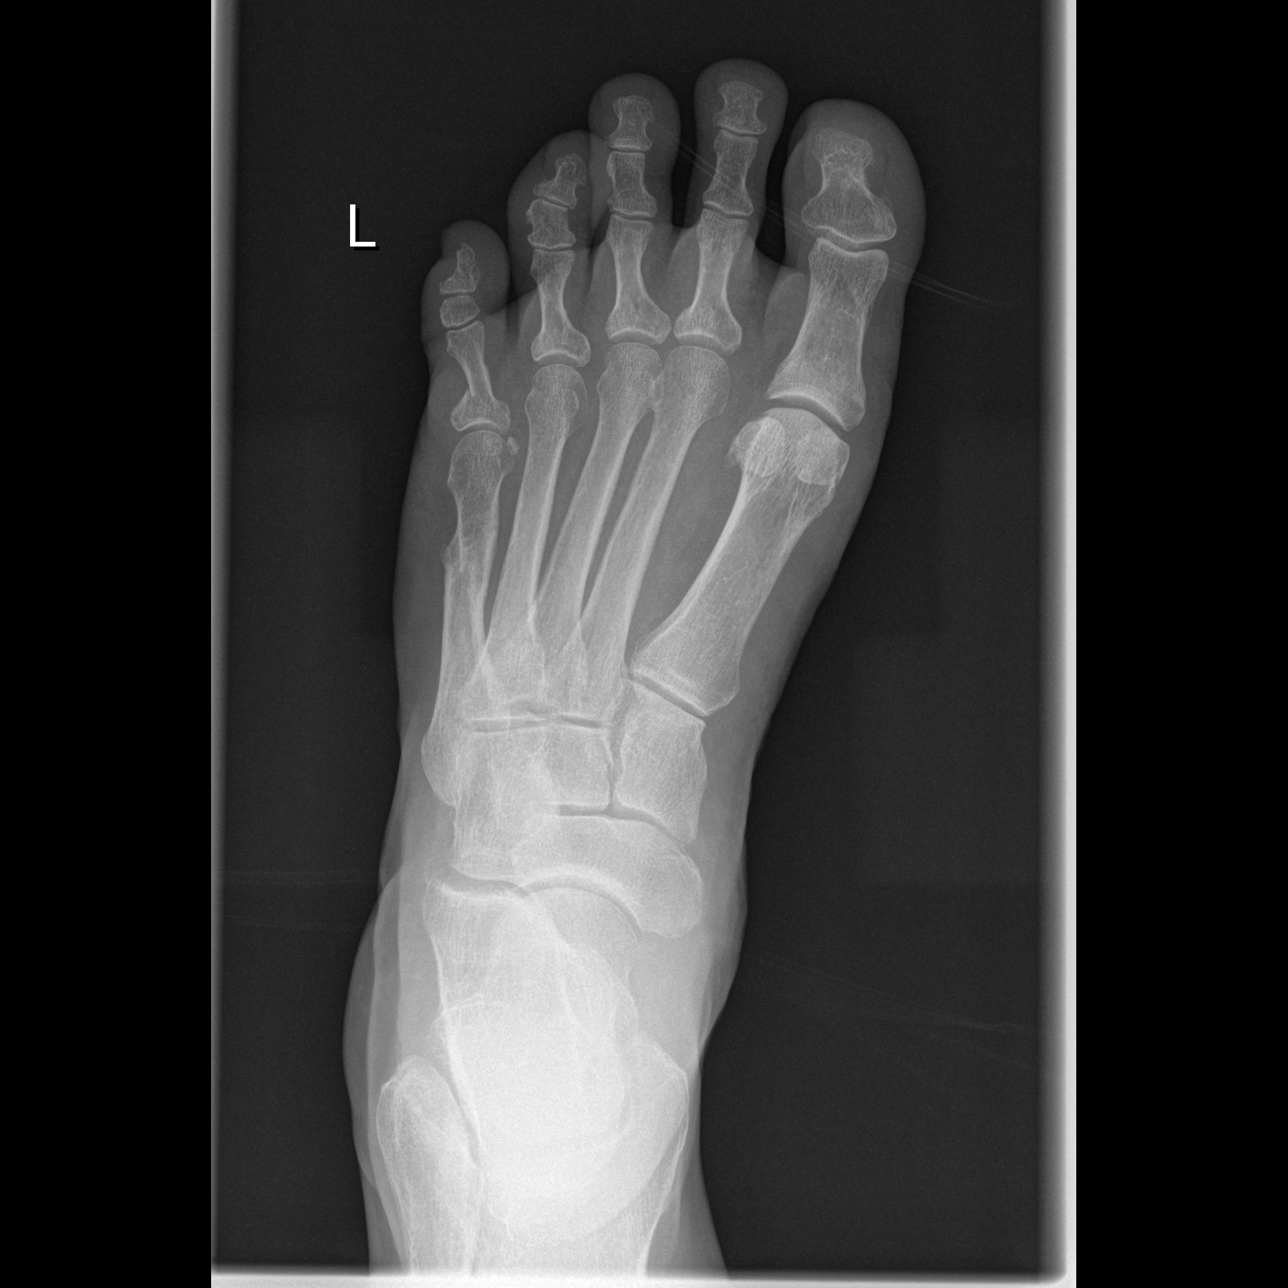

[t foot oblique left]
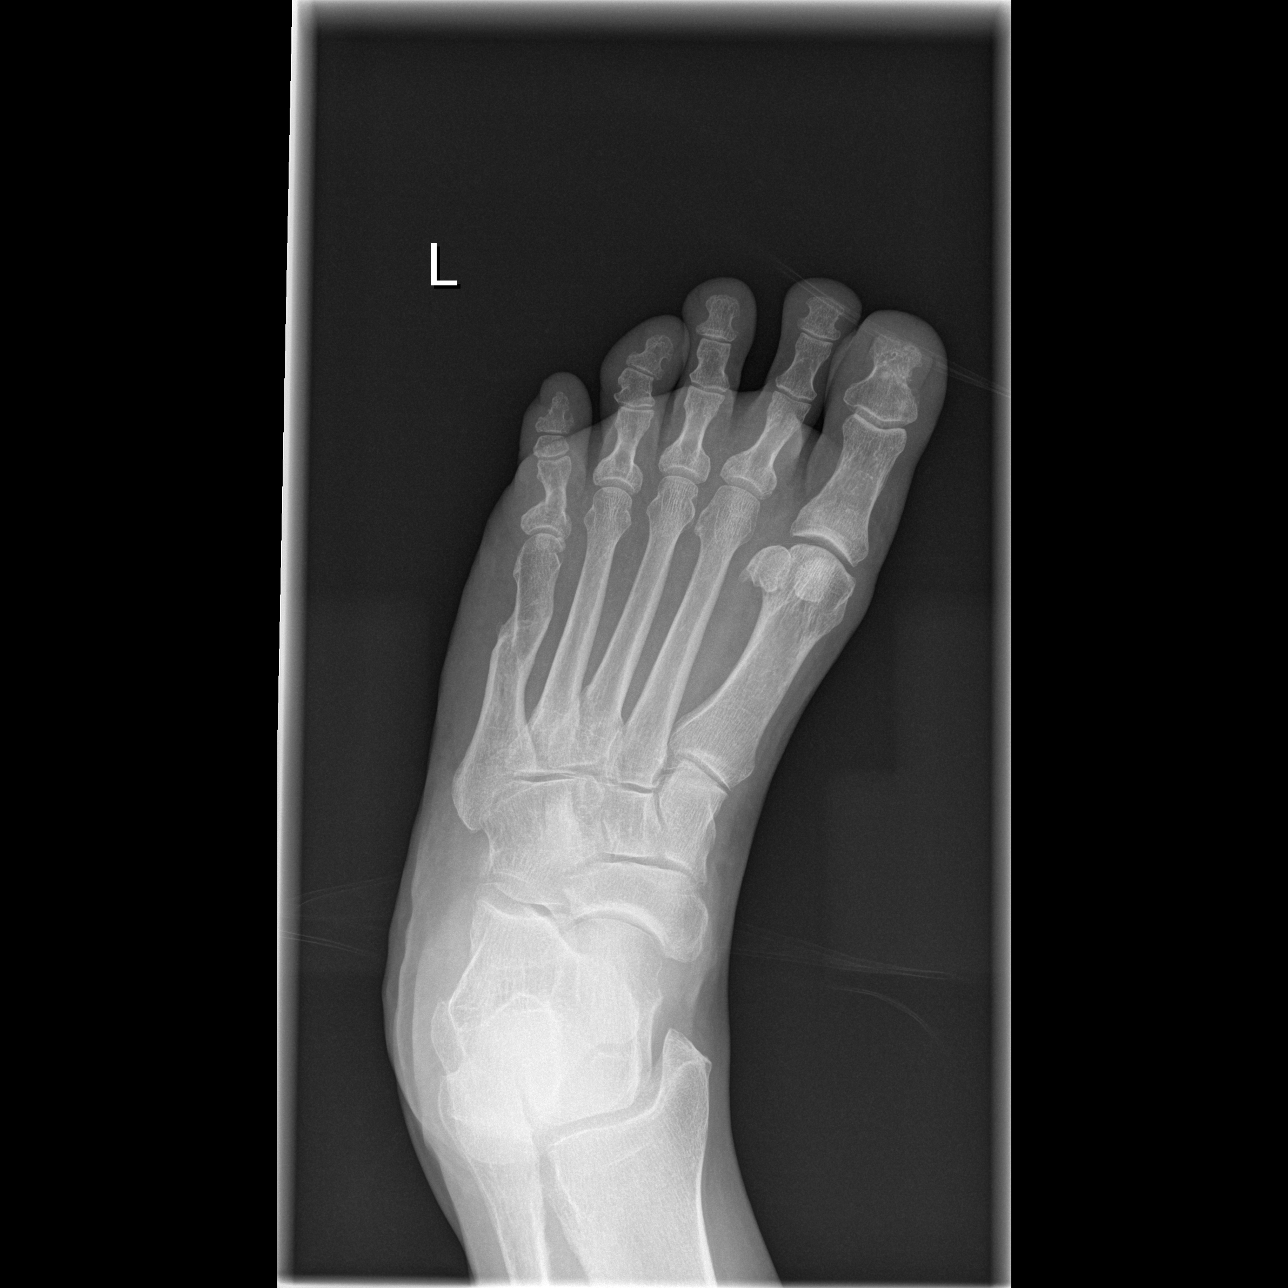

[t foot lat left]
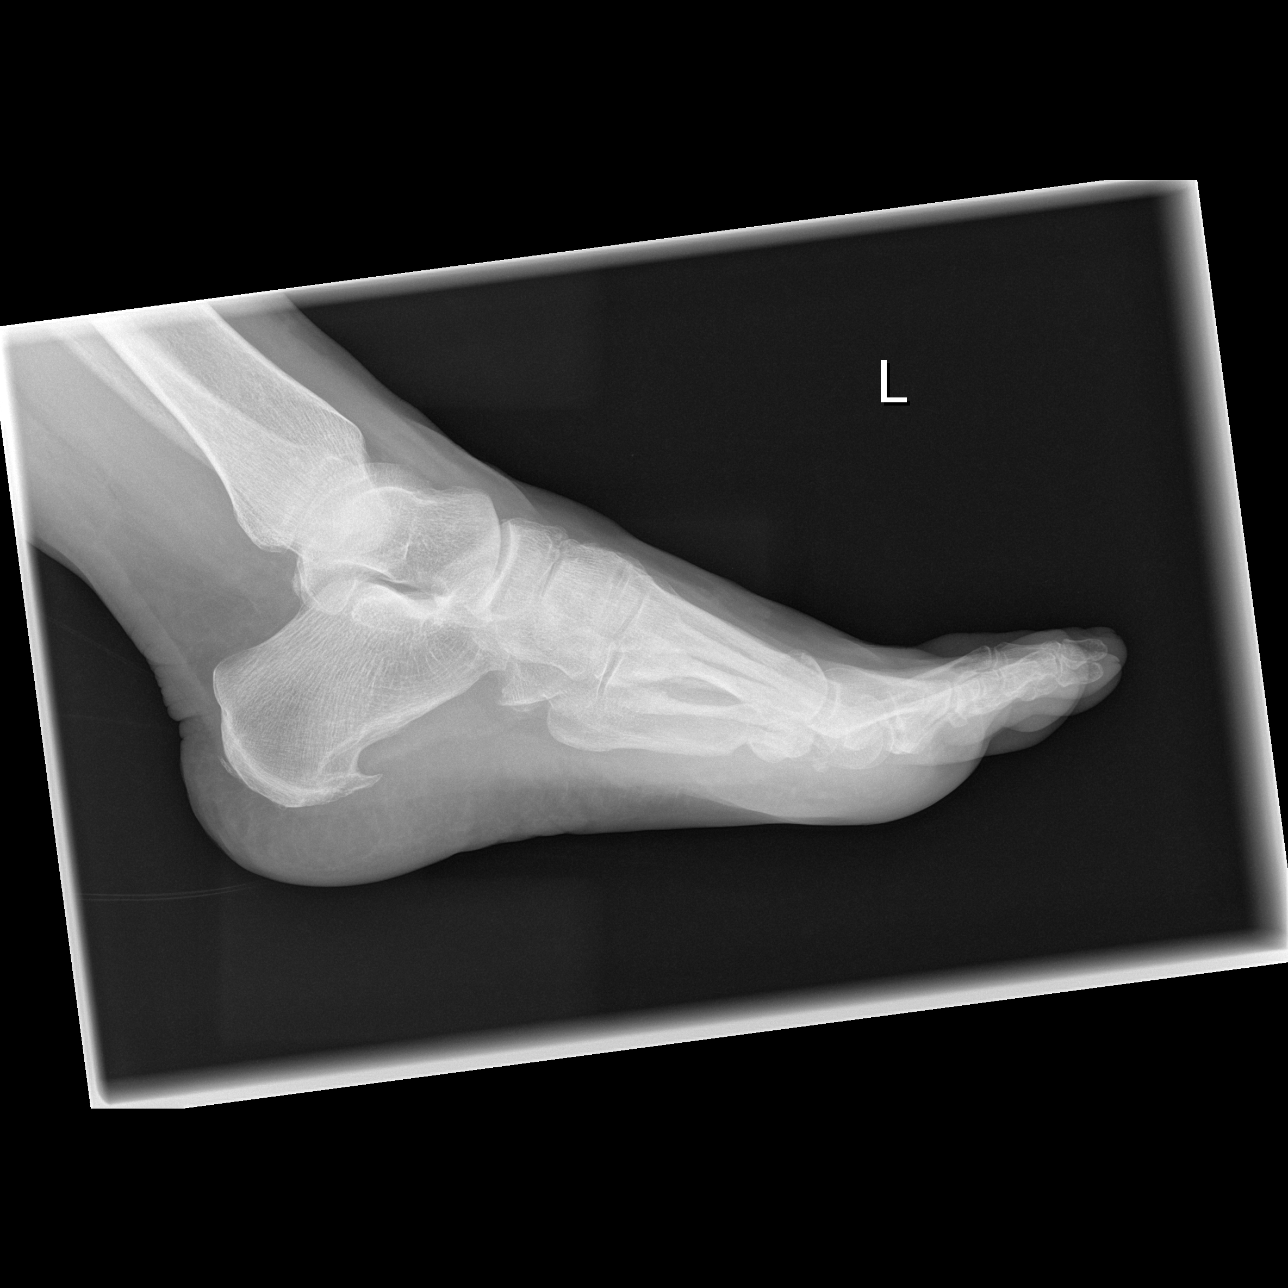

[3 of 3 positions shown; findings below may reference images not displayed]

FINDINGS: An acute fracture deformity is seen involving the proximal phalanx
of the fifth left toe. Mild lateral angulation of the distal
fracture site is noted. There is no evidence of dislocation. A
chronic fracture of the fifth left metatarsal is also seen. Mild
soft tissue swelling is seen adjacent to the previously noted
fracture site.
IMPRESSION: Acute fracture of the proximal phalanx of the fifth left toe.

## 2024-02-16 ENCOUNTER — Emergency Department (HOSPITAL_BASED_OUTPATIENT_CLINIC_OR_DEPARTMENT_OTHER): Payer: Self-pay

## 2024-02-16 ENCOUNTER — Emergency Department (HOSPITAL_BASED_OUTPATIENT_CLINIC_OR_DEPARTMENT_OTHER)
Admission: EM | Admit: 2024-02-16 | Discharge: 2024-02-17 | Disposition: A | Discharge status: Home / Self Care | Payer: Self-pay | Attending: Emergency Medicine | Admitting: Emergency Medicine

## 2024-02-16 ENCOUNTER — Other Ambulatory Visit: Payer: Self-pay

## 2024-02-16 ENCOUNTER — Encounter (HOSPITAL_BASED_OUTPATIENT_CLINIC_OR_DEPARTMENT_OTHER): Payer: Self-pay

## 2024-02-16 DIAGNOSIS — M542 Cervicalgia: Secondary | ICD-10-CM | POA: Diagnosis not present

## 2024-02-16 DIAGNOSIS — R002 Palpitations: Secondary | ICD-10-CM | POA: Diagnosis present

## 2024-02-16 DIAGNOSIS — R42 Dizziness and giddiness: Secondary | ICD-10-CM | POA: Insufficient documentation

## 2024-02-16 DIAGNOSIS — R6884 Jaw pain: Secondary | ICD-10-CM | POA: Diagnosis not present

## 2024-02-16 DIAGNOSIS — I10 Essential (primary) hypertension: Secondary | ICD-10-CM | POA: Diagnosis not present

## 2024-02-16 DIAGNOSIS — Z79899 Other long term (current) drug therapy: Secondary | ICD-10-CM | POA: Diagnosis not present

## 2024-02-16 LAB — CBC
HCT: 41.8 % (ref 36.0–46.0)
Hemoglobin: 14.2 g/dL (ref 12.0–15.0)
MCH: 29.4 pg (ref 26.0–34.0)
MCHC: 34 g/dL (ref 30.0–36.0)
MCV: 86.5 fL (ref 80.0–100.0)
Platelets: 275 K/uL (ref 150–400)
RBC: 4.83 MIL/uL (ref 3.87–5.11)
RDW: 12.9 % (ref 11.5–15.5)
WBC: 5.6 K/uL (ref 4.0–10.5)
nRBC: 0 % (ref 0.0–0.2)

## 2024-02-16 NOTE — ED Provider Notes (Signed)
 [place] EMERGENCY DEPARTMENT AT [facility] Provider Note   CSN: [id number] Arrival date & time: 02/16/24  2235     Patient presents with: Chest Pain   Mercedes Robertson is a 58 y.o. female.   The history is provided by the patient and a relative.   Patient with history of hypertension presents for palpitations Patient reports she was watching a movie about 1 to 2 hours ago when she started having the feeling that her heart was racing.  She then began having tightness in her neck and jaw.  Patient reports recent diagnosis of sinusitis and has been on antibiotics. She also reports being seen by her PCP earlier today and received steroid injections for hip bursitis  No previous history of A-fib.  No history of CAD/VTE.  Denies any excessive caffeine use.  She is a non-smoker. No fevers or vomiting.  She reports mild dizziness but no syncope.  No shortness of breath.  No pleuritic pain She did have a glass of wine prior to this episode No history of thyroid disease Past Medical History:  Diagnosis Date   Allergy    Hypertension    Vertigo     Prior to Admission medications   Medication Sig Start Date End Date Taking? Authorizing Provider  cetirizine (ZYRTEC) 10 MG tablet Take 10 mg by mouth daily. Patient uses this medication for allergy symptoms.    [provider]  losartan (COZAAR) 25 MG tablet Take 25 mg by mouth daily.    [provider]  meclizine (ANTIVERT) 12.5 MG tablet Take 12.5 mg by mouth daily as needed. 03/25/15   [provider]    Allergies: Lisinopril    Review of Systems  Constitutional:  Negative for fever.  Respiratory:  Negative for shortness of breath.   Cardiovascular:  Positive for palpitations.  Neurological:  Positive for dizziness. Negative for syncope.    Updated Vital Signs BP 126/78 (BP Location: Right Arm)   Pulse 79   Temp 99.8 F (37.7 C) (Oral)   Resp 20   Ht 1.6 m (5' 3)   Wt 74.8 kg   LMP  03/10/2017   SpO2 95%   BMI 29.23 kg/m   Physical Exam CONSTITUTIONAL: Well developed/well nourished HEAD: Normocephalic/atraumatic EYES: EOMI/PERRL ENMT: Mucous membranes moist NECK: supple no meningeal signs, no thyromegaly SPINE/BACK:entire spine nontender CV: S1/S2 noted, no murmurs/rubs/gallops noted LUNGS: Lungs are clear to auscultation bilaterally, no apparent distress ABDOMEN: soft, nontender, no rebound or guarding, bowel sounds noted throughout abdomen GU:no cva tenderness NEURO: Pt is awake/alert/appropriate, moves all extremitiesx4.  No facial droop.   EXTREMITIES: pulses normal/equalx4, full ROM, no calf tenderness SKIN: warm, color normal PSYCH: no abnormalities of mood noted, alert and oriented to situation  (all labs ordered are listed, but only abnormal results are displayed) Labs Reviewed  BASIC METABOLIC PANEL WITH GFR - Abnormal; Notable for the following components:      Result Value   Glucose, Bld 114 (*)    All other components within normal limits  CBC  MAGNESIUM  TROPONIN T, HIGH SENSITIVITY    EKG: EKG Interpretation Date/Time:  Friday February 16 2024 22:46:49 EDT Ventricular Rate:  82 PR Interval:  140 QRS Duration:  90 QT Interval:  370 QTC Calculation: 433 R Axis:   34  Text Interpretation: Sinus rhythm Nonspecific T abnormalities, anterior leads Confirmed by Yolande Charleston [phone]) on 02/16/2024 10:50:46 PM  Radiology: [facility] Chest 2 View Result Date: 02/16/2024 CLINICAL DATA:  Chest  pain. EXAM: CHEST - 2 VIEW COMPARISON:  Sep 06, 2020 FINDINGS: The heart size and mediastinal contours are within normal limits. Both lungs are clear. The visualized skeletal structures are unremarkable. IMPRESSION: No active cardiopulmonary disease. Electronically Signed   By: Suzen Dials M.D.   On: 02/16/2024 23:29     Procedures   Medications Ordered in the ED - No data to display  Clinical Course as of 02/17/24 0104  Fri Feb 16, 2024  2333  This patient is well-appearing.  Reports onset of palpitations & neck and jaw tightness 2 hours ago. No known history of A-fib or other dysrhythmia Currently in sinus rhythm without any acute changes from prior EKG.  Workup pending at this time [DW]  Sat Feb 17, 2024  0103 Patient feels back to baseline.  She has had no new symptoms of palpitations.  Denies any chest pain. Telemetry monitoring has been reviewed and there are no signs of any dysrhythmia  Discussed she can follow-up as an outpatient for heart monitoring. [DW]    Clinical Course User Index [DW] Midge Golas, MD                                 Medical Decision Making Amount and/or Complexity of Data Reviewed Labs: ordered. Radiology: ordered.   This patient presents to the ED for concern of palpitations, this involves an extensive number of treatment options, and is a complaint that carries with it a high risk of complications and morbidity.  The differential diagnosis includes but is not limited to sinus tachycardia, anxiety, pulmonary embolism, electrolyte abnormality, atrial fibrillation, atrial flutter, SVT, WPW, ventricular tachycardia  Comorbidities that complicate the patient evaluation: Patient's presentation is complicated by their history of hypertension  Additional history obtained: Additional history obtained from son is at the bedside Records reviewed Primary Care Documents  Lab Tests: I Ordered, and personally interpreted labs.  The pertinent results include:  labs reassuring  Imaging Studies ordered: I ordered imaging studies including X-ray chest  I independently visualized and interpreted imaging which showed no acute findings I agree with the radiologist interpretation  Cardiac Monitoring: The patient was maintained on a cardiac monitor.  I personally viewed and interpreted the cardiac monitor which showed an underlying rhythm of:  sinus rhythm  Test Considered: I have low suspicion for ACS  or PE given her history and symptoms.  Will defer further workup at this time No indication for admission  Reevaluation: After the interventions noted above, I reevaluated the patient and found that they have :improved  Complexity of problems addressed: Patient's presentation is most consistent with  acute presentation with potential threat to life or bodily function  Disposition: After consideration of the diagnostic results and the patient's response to treatment,  I feel that the patent would benefit from discharge  .        Final diagnoses:  Palpitations    ED Discharge Orders     None          Midge Golas, MD 02/17/24 0104

## 2024-02-16 NOTE — ED Triage Notes (Signed)
 Pt to Ed from home with c/o a racing heart and cp which began in the past hour radiating into her jaws bilaterally. Pt states she got a steroid shot today for hip bursitis. Rates cp 6/10. VSS, NADN.

## 2024-02-17 LAB — BASIC METABOLIC PANEL WITH GFR
Anion gap: 12 (ref 5–15)
BUN: 13 mg/dL (ref 6–20)
CO2: 22 mmol/L (ref 22–32)
Calcium: 9.8 mg/dL (ref 8.9–10.3)
Chloride: 104 mmol/L (ref 98–111)
Creatinine, Ser: 0.44 mg/dL (ref 0.44–1.00)
GFR, Estimated: >60 mL/min (ref >60)
Glucose, Bld: 114 mg/dL — ABNORMAL HIGH (ref 70–99)
Potassium: 3.7 mmol/L (ref 3.5–5.1)
Sodium: 139 mmol/L (ref 135–145)

## 2024-02-17 LAB — TROPONIN T, HIGH SENSITIVITY: Troponin T High Sensitivity: <15 ng/L (ref 0–19)

## 2024-02-17 LAB — MAGNESIUM: Magnesium: 1.9 mg/dL (ref 1.7–2.4)

## 2024-02-17 NOTE — Discharge Instructions (Addendum)
 Once you return from your trip, you can follow-up with your primary doctor as you may need to have heart monitoring.  Will also give information for you to follow-up with a cardiologist  Have a fun and safe trip to [place]!
